# Patient Record
Sex: Female | Born: 1965 | Race: White | Hispanic: No | Smoking: Never smoker
Health system: Southern US, Community
[De-identification: ages and names within clinical notes are randomized; demographics above are authoritative.]

## PROBLEM LIST (undated history)

## (undated) DIAGNOSIS — F419 Anxiety disorder, unspecified: Secondary | ICD-10-CM

## (undated) DIAGNOSIS — C801 Malignant (primary) neoplasm, unspecified: Secondary | ICD-10-CM

## (undated) DIAGNOSIS — Z87442 Personal history of urinary calculi: Secondary | ICD-10-CM

## (undated) DIAGNOSIS — R519 Headache, unspecified: Secondary | ICD-10-CM

## (undated) DIAGNOSIS — G8929 Other chronic pain: Secondary | ICD-10-CM

## (undated) DIAGNOSIS — M549 Dorsalgia, unspecified: Secondary | ICD-10-CM

## (undated) DIAGNOSIS — J45909 Unspecified asthma, uncomplicated: Secondary | ICD-10-CM

## (undated) DIAGNOSIS — R51 Headache: Secondary | ICD-10-CM

## (undated) DIAGNOSIS — M1990 Unspecified osteoarthritis, unspecified site: Secondary | ICD-10-CM

## (undated) DIAGNOSIS — R200 Anesthesia of skin: Secondary | ICD-10-CM

## (undated) HISTORY — PX: BACK SURGERY: SHX140

---

## 1998-06-04 DIAGNOSIS — C801 Malignant (primary) neoplasm, unspecified: Secondary | ICD-10-CM

## 1998-06-04 HISTORY — PX: ABDOMINAL HYSTERECTOMY: SHX81

## 1998-06-04 HISTORY — DX: Malignant (primary) neoplasm, unspecified: C80.1

## 2011-12-10 DIAGNOSIS — M5136 Other intervertebral disc degeneration, lumbar region: Secondary | ICD-10-CM | POA: Insufficient documentation

## 2011-12-26 DIAGNOSIS — F419 Anxiety disorder, unspecified: Secondary | ICD-10-CM | POA: Diagnosis present

## 2016-01-22 DIAGNOSIS — G894 Chronic pain syndrome: Secondary | ICD-10-CM | POA: Diagnosis present

## 2016-04-11 ENCOUNTER — Inpatient Hospital Stay (HOSPITAL_COMMUNITY)
Admission: EM | Admit: 2016-04-11 | Discharge: 2016-04-13 | DRG: 694 | Disposition: A | Discharge status: Home / Self Care | Payer: Medicare PPO | Attending: Nephrology | Admitting: Nephrology

## 2016-04-11 ENCOUNTER — Emergency Department (HOSPITAL_COMMUNITY): Payer: Medicare PPO

## 2016-04-11 ENCOUNTER — Encounter (HOSPITAL_COMMUNITY): Payer: Self-pay | Admitting: Emergency Medicine

## 2016-04-11 DIAGNOSIS — Z79891 Long term (current) use of opiate analgesic: Secondary | ICD-10-CM

## 2016-04-11 DIAGNOSIS — J4540 Moderate persistent asthma, uncomplicated: Secondary | ICD-10-CM | POA: Diagnosis present

## 2016-04-11 DIAGNOSIS — G894 Chronic pain syndrome: Secondary | ICD-10-CM | POA: Diagnosis not present

## 2016-04-11 DIAGNOSIS — M961 Postlaminectomy syndrome, not elsewhere classified: Secondary | ICD-10-CM | POA: Diagnosis present

## 2016-04-11 DIAGNOSIS — Z79899 Other long term (current) drug therapy: Secondary | ICD-10-CM

## 2016-04-11 DIAGNOSIS — N200 Calculus of kidney: Secondary | ICD-10-CM | POA: Diagnosis not present

## 2016-04-11 DIAGNOSIS — Z91030 Bee allergy status: Secondary | ICD-10-CM

## 2016-04-11 DIAGNOSIS — N132 Hydronephrosis with renal and ureteral calculous obstruction: Principal | ICD-10-CM | POA: Diagnosis present

## 2016-04-11 DIAGNOSIS — Z87442 Personal history of urinary calculi: Secondary | ICD-10-CM

## 2016-04-11 DIAGNOSIS — F419 Anxiety disorder, unspecified: Secondary | ICD-10-CM | POA: Diagnosis present

## 2016-04-11 DIAGNOSIS — Z882 Allergy status to sulfonamides status: Secondary | ICD-10-CM

## 2016-04-11 DIAGNOSIS — R52 Pain, unspecified: Secondary | ICD-10-CM

## 2016-04-11 DIAGNOSIS — Z881 Allergy status to other antibiotic agents status: Secondary | ICD-10-CM

## 2016-04-11 DIAGNOSIS — Z886 Allergy status to analgesic agent status: Secondary | ICD-10-CM

## 2016-04-11 HISTORY — DX: Dorsalgia, unspecified: M54.9

## 2016-04-11 HISTORY — DX: Other chronic pain: G89.29

## 2016-04-11 HISTORY — DX: Anxiety disorder, unspecified: F41.9

## 2016-04-11 LAB — CBC WITH DIFFERENTIAL/PLATELET
Basophils Absolute: 0 10*3/uL (ref 0.0–0.1)
Basophils Relative: 0 %
EOS PCT: 3 %
Eosinophils Absolute: 0.2 10*3/uL (ref 0.0–0.7)
HCT: 38.9 % (ref 36.0–46.0)
Hemoglobin: 12.8 g/dL (ref 12.0–15.0)
LYMPHS ABS: 2.4 10*3/uL (ref 0.7–4.0)
Lymphocytes Relative: 46 %
MCH: 30.4 pg (ref 26.0–34.0)
MCHC: 32.9 g/dL (ref 30.0–36.0)
MCV: 92.4 fL (ref 78.0–100.0)
MONO ABS: 0.5 10*3/uL (ref 0.1–1.0)
MONOS PCT: 11 %
Neutro Abs: 2.1 10*3/uL (ref 1.7–7.7)
Neutrophils Relative %: 40 %
PLATELETS: 232 10*3/uL (ref 150–400)
RBC: 4.21 MIL/uL (ref 3.87–5.11)
RDW: 12.7 % (ref 11.5–15.5)
WBC: 5.2 10*3/uL (ref 4.0–10.5)

## 2016-04-11 LAB — URINE MICROSCOPIC-ADD ON: RBC / HPF: NONE SEEN RBC/hpf (ref 0–5)

## 2016-04-11 LAB — BASIC METABOLIC PANEL
Anion gap: 8 (ref 5–15)
BUN: 18 mg/dL (ref 6–20)
CALCIUM: 9.3 mg/dL (ref 8.9–10.3)
CO2: 29 mmol/L (ref 22–32)
Chloride: 101 mmol/L (ref 101–111)
Creatinine, Ser: 0.98 mg/dL (ref 0.44–1.00)
GFR calc Af Amer: >60 mL/min (ref >60)
GLUCOSE: 115 mg/dL — AB (ref 65–99)
Potassium: 4.3 mmol/L (ref 3.5–5.1)
Sodium: 138 mmol/L (ref 135–145)

## 2016-04-11 LAB — URINALYSIS, ROUTINE W REFLEX MICROSCOPIC
Bilirubin Urine: NEGATIVE
GLUCOSE, UA: NEGATIVE mg/dL
HGB URINE DIPSTICK: NEGATIVE
Ketones, ur: NEGATIVE mg/dL
Nitrite: NEGATIVE
PH: 7 (ref 5.0–8.0)
PROTEIN: NEGATIVE mg/dL
Specific Gravity, Urine: 1.012 (ref 1.005–1.030)

## 2016-04-11 MED ORDER — DIPHENHYDRAMINE HCL 50 MG/ML IJ SOLN
25.0000 mg | Freq: Once | INTRAMUSCULAR | Status: AC
  Administered 2016-04-11: 25 mg via INTRAVENOUS
  Filled 2016-04-11: qty 1

## 2016-04-11 MED ORDER — MORPHINE SULFATE (PF) 4 MG/ML IV SOLN: 4.0000 mg | INTRAVENOUS | Status: DC | PRN

## 2016-04-11 MED ORDER — SODIUM CHLORIDE 0.9 % IV SOLN
Freq: Once | INTRAVENOUS | Status: AC
  Administered 2016-04-11: 21:00:00 via INTRAVENOUS

## 2016-04-11 MED ORDER — ONDANSETRON HCL 4 MG/2ML IJ SOLN
4.0000 mg | Freq: Once | INTRAMUSCULAR | Status: AC
  Administered 2016-04-11: 4 mg via INTRAVENOUS
  Filled 2016-04-11: qty 2

## 2016-04-11 MED ORDER — HYDROMORPHONE HCL 2 MG/ML IJ SOLN: 1.0000 mg | Freq: Once | INTRAMUSCULAR | Status: DC

## 2016-04-11 MED ORDER — HYDROMORPHONE HCL 2 MG/ML IJ SOLN
1.0000 mg | INTRAMUSCULAR | Status: AC | PRN
  Administered 2016-04-11 (×2): 1 mg via INTRAVENOUS
  Filled 2016-04-11 (×2): qty 1

## 2016-04-11 MED ORDER — DEXTROSE 5 % IV SOLN
1.0000 g | Freq: Once | INTRAVENOUS | Status: AC
  Administered 2016-04-11: 1 g via INTRAVENOUS
  Filled 2016-04-11: qty 10

## 2016-04-11 MED ORDER — SODIUM CHLORIDE 0.9 % IV BOLUS (SEPSIS)
1000.0000 mL | Freq: Once | INTRAVENOUS | Status: AC
  Administered 2016-04-11: 1000 mL via INTRAVENOUS
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
## 2016-04-11 NOTE — ED Triage Notes (Signed)
Per EMS: pt from airport c/o dizziness upon leaving flight with some nausea

## 2016-04-11 NOTE — ED Notes (Signed)
Patient transported to CT 
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
## 2016-04-11 NOTE — ED Notes (Signed)
repaged urology

## 2016-04-12 ENCOUNTER — Observation Stay (HOSPITAL_COMMUNITY): Payer: Medicare PPO | Admitting: Certified Registered Nurse Anesthetist

## 2016-04-12 ENCOUNTER — Encounter (HOSPITAL_COMMUNITY)
Admission: EM | Disposition: A | Discharge status: Home / Self Care | Payer: Self-pay | Source: Home / Self Care | Attending: Nephrology

## 2016-04-12 ENCOUNTER — Inpatient Hospital Stay: Admit: 2016-04-12 | Payer: Medicare PPO | Admitting: Urology

## 2016-04-12 DIAGNOSIS — Z881 Allergy status to other antibiotic agents status: Secondary | ICD-10-CM | POA: Diagnosis not present

## 2016-04-12 DIAGNOSIS — M961 Postlaminectomy syndrome, not elsewhere classified: Secondary | ICD-10-CM | POA: Diagnosis present

## 2016-04-12 DIAGNOSIS — Z87442 Personal history of urinary calculi: Secondary | ICD-10-CM | POA: Diagnosis not present

## 2016-04-12 DIAGNOSIS — J4540 Moderate persistent asthma, uncomplicated: Secondary | ICD-10-CM | POA: Diagnosis present

## 2016-04-12 DIAGNOSIS — N132 Hydronephrosis with renal and ureteral calculous obstruction: Secondary | ICD-10-CM | POA: Diagnosis present

## 2016-04-12 DIAGNOSIS — Z91030 Bee allergy status: Secondary | ICD-10-CM | POA: Diagnosis not present

## 2016-04-12 DIAGNOSIS — Z886 Allergy status to analgesic agent status: Secondary | ICD-10-CM | POA: Diagnosis not present

## 2016-04-12 DIAGNOSIS — Z79899 Other long term (current) drug therapy: Secondary | ICD-10-CM | POA: Diagnosis not present

## 2016-04-12 DIAGNOSIS — G894 Chronic pain syndrome: Secondary | ICD-10-CM | POA: Diagnosis present

## 2016-04-12 DIAGNOSIS — Z79891 Long term (current) use of opiate analgesic: Secondary | ICD-10-CM | POA: Diagnosis not present

## 2016-04-12 DIAGNOSIS — Z882 Allergy status to sulfonamides status: Secondary | ICD-10-CM | POA: Diagnosis not present

## 2016-04-12 DIAGNOSIS — R52 Pain, unspecified: Secondary | ICD-10-CM | POA: Diagnosis not present

## 2016-04-12 DIAGNOSIS — N200 Calculus of kidney: Secondary | ICD-10-CM | POA: Diagnosis present

## 2016-04-12 DIAGNOSIS — F419 Anxiety disorder, unspecified: Secondary | ICD-10-CM | POA: Diagnosis present

## 2016-04-12 HISTORY — PX: CYSTOSCOPY W/ URETERAL STENT PLACEMENT: SHX1429

## 2016-04-12 LAB — RAPID URINE DRUG SCREEN, HOSP PERFORMED
AMPHETAMINES: NOT DETECTED
BENZODIAZEPINES: NOT DETECTED
Barbiturates: NOT DETECTED
COCAINE: NOT DETECTED
OPIATES: POSITIVE — AB
Tetrahydrocannabinol: NOT DETECTED

## 2016-04-12 LAB — SURGICAL PCR SCREEN
MRSA, PCR: NEGATIVE
Staphylococcus aureus: POSITIVE — AB

## 2016-04-12 SURGERY — CYSTOSCOPY, WITH RETROGRADE PYELOGRAM AND URETERAL STENT INSERTION
Anesthesia: General | Laterality: Right

## 2016-04-12 MED ORDER — 0.9 % SODIUM CHLORIDE (POUR BTL) OPTIME
TOPICAL | Status: DC | PRN
  Administered 2016-04-12: 1000 mL

## 2016-04-12 MED ORDER — ONDANSETRON HCL 4 MG/2ML IJ SOLN
INTRAMUSCULAR | Status: DC | PRN
  Administered 2016-04-12: 4 mg via INTRAVENOUS

## 2016-04-12 MED ORDER — FENTANYL CITRATE (PF) 100 MCG/2ML IJ SOLN
INTRAMUSCULAR | Status: AC
  Filled 2016-04-12: qty 2

## 2016-04-12 MED ORDER — ACETAMINOPHEN 325 MG PO TABS
650.0000 mg | ORAL_TABLET | Freq: Four times a day (QID) | ORAL | Status: DC | PRN
  Administered 2016-04-13: 650 mg via ORAL
  Filled 2016-04-12: qty 2

## 2016-04-12 MED ORDER — ONDANSETRON HCL 4 MG/2ML IJ SOLN
4.0000 mg | Freq: Four times a day (QID) | INTRAMUSCULAR | Status: DC | PRN
Start: 2016-04-12 — End: 2016-04-13
  Administered 2016-04-12: 4 mg via INTRAVENOUS
  Filled 2016-04-12: qty 2

## 2016-04-12 MED ORDER — LIDOCAINE HCL (CARDIAC) 20 MG/ML IV SOLN
INTRAVENOUS | Status: DC | PRN
  Administered 2016-04-12: 100 mg via INTRAVENOUS

## 2016-04-12 MED ORDER — LACTATED RINGERS IV SOLN
INTRAVENOUS | Status: DC | PRN
  Administered 2016-04-12: 18:00:00 via INTRAVENOUS

## 2016-04-12 MED ORDER — PROPOFOL 10 MG/ML IV BOLUS
INTRAVENOUS | Status: AC
Start: 2016-04-12 — End: 2016-04-12
  Filled 2016-04-12: qty 20

## 2016-04-12 MED ORDER — MIDAZOLAM HCL 2 MG/2ML IJ SOLN
INTRAMUSCULAR | Status: AC
  Filled 2016-04-12: qty 2

## 2016-04-12 MED ORDER — SUCCINYLCHOLINE CHLORIDE 20 MG/ML IJ SOLN
INTRAMUSCULAR | Status: AC
  Filled 2016-04-12: qty 1

## 2016-04-12 MED ORDER — IOHEXOL 300 MG/ML  SOLN
INTRAMUSCULAR | Status: DC | PRN
  Administered 2016-04-12: 5 mL via INTRAVENOUS

## 2016-04-12 MED ORDER — OXYCODONE HCL 5 MG PO TABS
5.0000 mg | ORAL_TABLET | ORAL | Status: DC | PRN
  Administered 2016-04-12 – 2016-04-13 (×3): 5 mg via ORAL
  Filled 2016-04-12 (×3): qty 1

## 2016-04-12 MED ORDER — ONDANSETRON HCL 4 MG PO TABS: 4.0000 mg | ORAL_TABLET | Freq: Four times a day (QID) | ORAL | Status: DC | PRN

## 2016-04-12 MED ORDER — HYDROMORPHONE HCL 1 MG/ML IJ SOLN
INTRAMUSCULAR | Status: AC
  Administered 2016-04-12: 0.5 mg via INTRAVENOUS
  Filled 2016-04-12: qty 1

## 2016-04-12 MED ORDER — SUCCINYLCHOLINE CHLORIDE 20 MG/ML IJ SOLN
INTRAMUSCULAR | Status: DC | PRN
  Administered 2016-04-12: 100 mg via INTRAVENOUS

## 2016-04-12 MED ORDER — ONDANSETRON HCL 4 MG/2ML IJ SOLN
INTRAMUSCULAR | Status: AC
  Filled 2016-04-12: qty 2

## 2016-04-12 MED ORDER — PROMETHAZINE HCL 25 MG/ML IJ SOLN
6.2500 mg | INTRAMUSCULAR | Status: DC | PRN
  Administered 2016-04-12: 6.25 mg via INTRAVENOUS

## 2016-04-12 MED ORDER — MORPHINE SULFATE ER 30 MG PO TBCR
30.0000 mg | EXTENDED_RELEASE_TABLET | Freq: Two times a day (BID) | ORAL | Status: DC
  Administered 2016-04-12 – 2016-04-13 (×3): 30 mg via ORAL
  Filled 2016-04-12 (×5): qty 1

## 2016-04-12 MED ORDER — GABAPENTIN 400 MG PO CAPS
800.0000 mg | ORAL_CAPSULE | Freq: Three times a day (TID) | ORAL | Status: DC
  Administered 2016-04-12 – 2016-04-13 (×3): 800 mg via ORAL
  Filled 2016-04-12 (×4): qty 2

## 2016-04-12 MED ORDER — SODIUM CHLORIDE 0.9 % IV SOLN
INTRAVENOUS | Status: DC
  Administered 2016-04-12 – 2016-04-13 (×4): via INTRAVENOUS

## 2016-04-12 MED ORDER — DEXAMETHASONE SODIUM PHOSPHATE 10 MG/ML IJ SOLN
INTRAMUSCULAR | Status: AC
  Filled 2016-04-12: qty 1

## 2016-04-12 MED ORDER — LIDOCAINE 2% (20 MG/ML) 5 ML SYRINGE
INTRAMUSCULAR | Status: AC
  Filled 2016-04-12: qty 5

## 2016-04-12 MED ORDER — ENOXAPARIN SODIUM 40 MG/0.4ML ~~LOC~~ SOLN
40.0000 mg | SUBCUTANEOUS | Status: DC
  Filled 2016-04-12 (×2): qty 0.4

## 2016-04-12 MED ORDER — OXYCODONE-ACETAMINOPHEN 5-325 MG PO TABS: 1.0000 | ORAL_TABLET | Freq: Four times a day (QID) | ORAL | Status: DC | PRN

## 2016-04-12 MED ORDER — TIZANIDINE HCL 4 MG PO TABS
4.0000 mg | ORAL_TABLET | Freq: Two times a day (BID) | ORAL | Status: DC
  Administered 2016-04-12 – 2016-04-13 (×2): 4 mg via ORAL
  Filled 2016-04-12 (×4): qty 1

## 2016-04-12 MED ORDER — PROPOFOL 10 MG/ML IV BOLUS
INTRAVENOUS | Status: DC | PRN
  Administered 2016-04-12: 170 mg via INTRAVENOUS

## 2016-04-12 MED ORDER — FENTANYL CITRATE (PF) 100 MCG/2ML IJ SOLN
INTRAMUSCULAR | Status: DC | PRN
  Administered 2016-04-12 (×2): 50 ug via INTRAVENOUS

## 2016-04-12 MED ORDER — CIPROFLOXACIN IN D5W 400 MG/200ML IV SOLN
INTRAVENOUS | Status: DC | PRN
  Administered 2016-04-12: 400 mg via INTRAVENOUS

## 2016-04-12 MED ORDER — MIDAZOLAM HCL 5 MG/5ML IJ SOLN
INTRAMUSCULAR | Status: DC | PRN
  Administered 2016-04-12: 2 mg via INTRAVENOUS

## 2016-04-12 MED ORDER — HYDROMORPHONE HCL 1 MG/ML IJ SOLN
1.0000 mg | INTRAMUSCULAR | Status: DC | PRN
  Administered 2016-04-12: 1 mg via INTRAVENOUS
  Filled 2016-04-12 (×2): qty 1

## 2016-04-12 MED ORDER — SODIUM CHLORIDE 0.9 % IR SOLN
Status: DC | PRN
  Administered 2016-04-12: 3000 mL

## 2016-04-12 MED ORDER — HYDROMORPHONE HCL 1 MG/ML IJ SOLN
0.2500 mg | INTRAMUSCULAR | Status: DC | PRN
  Administered 2016-04-12 (×4): 0.5 mg via INTRAVENOUS

## 2016-04-12 MED ORDER — HYDROMORPHONE HCL 1 MG/ML IJ SOLN
1.0000 mg | INTRAMUSCULAR | Status: DC | PRN
  Administered 2016-04-12 – 2016-04-13 (×2): 1 mg via INTRAVENOUS
  Filled 2016-04-12: qty 1

## 2016-04-12 MED ORDER — CIPROFLOXACIN IN D5W 400 MG/200ML IV SOLN
INTRAVENOUS | Status: AC
  Filled 2016-04-12: qty 200

## 2016-04-12 MED ORDER — DEXAMETHASONE SODIUM PHOSPHATE 10 MG/ML IJ SOLN
INTRAMUSCULAR | Status: DC | PRN
  Administered 2016-04-12: 10 mg via INTRAVENOUS

## 2016-04-12 MED ORDER — MOMETASONE FURO-FORMOTEROL FUM 200-5 MCG/ACT IN AERO
2.0000 | INHALATION_SPRAY | Freq: Two times a day (BID) | RESPIRATORY_TRACT | Status: DC
  Administered 2016-04-12 – 2016-04-13 (×3): 2 via RESPIRATORY_TRACT
  Filled 2016-04-12: qty 8.8

## 2016-04-12 MED ORDER — VENLAFAXINE HCL 50 MG PO TABS
50.0000 mg | ORAL_TABLET | Freq: Two times a day (BID) | ORAL | Status: DC
Start: 2016-04-12 — End: 2016-04-13
  Administered 2016-04-12 – 2016-04-13 (×2): 50 mg via ORAL
  Filled 2016-04-12 (×4): qty 1

## 2016-04-12 MED ORDER — ACETAMINOPHEN 650 MG RE SUPP: 650.0000 mg | Freq: Four times a day (QID) | RECTAL | Status: DC | PRN

## 2016-04-12 MED ORDER — PROMETHAZINE HCL 25 MG/ML IJ SOLN
INTRAMUSCULAR | Status: AC
  Filled 2016-04-12: qty 1

## 2016-04-12 SURGICAL SUPPLY — 11 items
BAG URO CATCHER STRL LF (MISCELLANEOUS) ×3 IMPLANT
CATH INTERMIT  6FR 70CM (CATHETERS) ×3 IMPLANT
CLOTH BEACON ORANGE TIMEOUT ST (SAFETY) ×3 IMPLANT
GLOVE BIOGEL M STRL SZ7.5 (GLOVE) ×3 IMPLANT
GOWN STRL REUS W/TWL LRG LVL3 (GOWN DISPOSABLE) ×6 IMPLANT
GUIDEWIRE STR DUAL SENSOR (WIRE) ×3 IMPLANT
MANIFOLD NEPTUNE II (INSTRUMENTS) ×3 IMPLANT
PACK CYSTO (CUSTOM PROCEDURE TRAY) ×3 IMPLANT
STENT CONTOUR 6FRX26X.038 (STENTS) ×3 IMPLANT
TUBING CONNECTING 10 (TUBING) IMPLANT
TUBING CONNECTING 10' (TUBING)

## 2016-04-12 NOTE — H&P (View-Only) (Signed)
Urology Consult  Referring physician: Katheran James Reason for referral: Colic   Chief Complaint: Colic  History of Present Illness: Right flank pain; ESWL unsuccessful; on trip to [place]; social history issues reviewed; transferred here from hospital;  Finally admitted ? Due to pain vs. Social aspects CT scan: Mild right hydro due to 5 mm stone at right pelvic brim Still having right flank pain and some nausea Afebrile Previous uscopes by history Modifying factors: There are no other modifying factors  Associated signs and symptoms: There are no other associated signs and symptoms Aggravating and relieving factors: There are no other aggravating or relieving factors Severity: Moderate Duration: Persistent     Past Medical History:  Diagnosis Date  . Anxiety   . Chronic back pain   . Kidney stones    History reviewed. No pertinent surgical history.  Medications: I have reviewed the patient's current medications. Allergies:  Allergies  Allergen Reactions  . Bee Venom Anaphylaxis  . Sulfamethoxazole Other (See Comments)  . Aspirin     Hives    . Sulfa Antibiotics     Upset stomach     Family History  Problem Relation Age of Onset  . Diabetes Mother   . Heart Problems Mother   . Leukemia Father    Social History:  reports that she has never smoked. She has never used smokeless tobacco. She reports that she does not drink alcohol or use drugs.  ROS: All systems are reviewed and negative except as noted. Rest negative  Physical Exam:  Vital signs in last 24 hours: Temp:  [98 F (36.7 C)-98.3 F (36.8 C)] 98 F (36.7 C) (11/09 0550) Pulse Rate:  [67-81] 67 (11/09 0550) Resp:  [9-20] 16 (11/09 0550) BP: (94-119)/(57-80) 106/75 (11/09 0550) SpO2:  [94 %-100 %] 97 % (11/09 1210) Weight:  [80.7 kg (178 lb)-92 kg (202 lb 12.8 oz)] 92 kg (202 lb 12.8 oz) (11/09 0110)  Cardiovascular: Skin warm; not flushed Respiratory: Breaths quiet; no shortness of  breath Abdomen: No masses Neurological: Normal sensation to touch Musculoskeletal: Normal motor function arms and legs Lymphatics: No inguinal adenopathy Skin: No rashes Genitourinary:looks a bit uncomfortable but non-toxic; no acute tenderness  Laboratory Data:  Results for orders placed or performed during the hospital encounter of 04/11/16 (from the past 72 hour(s))  CBC with Differential/Platelet     Status: None   Collection Time: 04/11/16  7:05 PM  Result Value Ref Range   WBC 5.2 4.0 - 10.5 K/uL   RBC 4.21 3.87 - 5.11 MIL/uL   Hemoglobin 12.8 12.0 - 15.0 g/dL   HCT 38.9 36.0 - 46.0 %   MCV 92.4 78.0 - 100.0 fL   MCH 30.4 26.0 - 34.0 pg   MCHC 32.9 30.0 - 36.0 g/dL   RDW 12.7 11.5 - 15.5 %   Platelets 232 150 - 400 K/uL   Neutrophils Relative % 40 %   Neutro Abs 2.1 1.7 - 7.7 K/uL   Lymphocytes Relative 46 %   Lymphs Abs 2.4 0.7 - 4.0 K/uL   Monocytes Relative 11 %   Monocytes Absolute 0.5 0.1 - 1.0 K/uL   Eosinophils Relative 3 %   Eosinophils Absolute 0.2 0.0 - 0.7 K/uL   Basophils Relative 0 %   Basophils Absolute 0.0 0.0 - 0.1 K/uL  Basic metabolic panel     Status: Abnormal   Collection Time: 04/11/16  7:05 PM  Result Value Ref Range   Sodium 138 135 - 145 mmol/L  Potassium 4.3 3.5 - 5.1 mmol/L   Chloride 101 101 - 111 mmol/L   CO2 29 22 - 32 mmol/L   Glucose, Bld 115 (H) 65 - 99 mg/dL   BUN 18 6 - 20 mg/dL   Creatinine, Ser 0.98 0.44 - 1.00 mg/dL   Calcium 9.3 8.9 - 10.3 mg/dL   GFR calc non Af Amer >60 >60 mL/min   GFR calc Af Amer >60 >60 mL/min    Comment: (NOTE) The eGFR has been calculated using the CKD EPI equation. This calculation has not been validated in all clinical situations. eGFR's persistently <60 mL/min signify possible Chronic Kidney Disease.    Anion gap 8 5 - 15  Urinalysis, Routine w reflex microscopic (not at [facility])     Status: Abnormal   Collection Time: 04/11/16  8:32 PM  Result Value Ref Range   Color, Urine YELLOW YELLOW    APPearance CLEAR CLEAR   Specific Gravity, Urine 1.012 1.005 - 1.030   pH 7.0 5.0 - 8.0   Glucose, UA NEGATIVE NEGATIVE mg/dL   Hgb urine dipstick NEGATIVE NEGATIVE   Bilirubin Urine NEGATIVE NEGATIVE   Ketones, ur NEGATIVE NEGATIVE mg/dL   Protein, ur NEGATIVE NEGATIVE mg/dL   Nitrite NEGATIVE NEGATIVE   Leukocytes, UA MODERATE (A) NEGATIVE  Urine microscopic-add on     Status: Abnormal   Collection Time: 04/11/16  8:32 PM  Result Value Ref Range   Squamous Epithelial / LPF 0-5 (A) NONE SEEN   WBC, UA 6-30 0 - 5 WBC/hpf   RBC / HPF NONE SEEN 0 - 5 RBC/hpf   Bacteria, UA RARE (A) NONE SEEN  Surgical pcr screen     Status: Abnormal   Collection Time: 04/12/16  2:06 AM  Result Value Ref Range   MRSA, PCR NEGATIVE NEGATIVE   Staphylococcus aureus POSITIVE (A) NEGATIVE    Comment:        The Xpert SA Assay (FDA approved for NASAL specimens in patients over 80 years of age), is one component of a comprehensive surveillance program.  Test performance has been validated by [facility] for patients greater than or equal to 28 year old. It is not intended to diagnose infection nor to guide or monitor treatment.   Urine rapid drug screen (hosp performed)     Status: Abnormal   Collection Time: 04/12/16  9:10 AM  Result Value Ref Range   Opiates POSITIVE (A) NONE DETECTED   Cocaine NONE DETECTED NONE DETECTED   Benzodiazepines NONE DETECTED NONE DETECTED   Amphetamines NONE DETECTED NONE DETECTED   Tetrahydrocannabinol NONE DETECTED NONE DETECTED   Barbiturates NONE DETECTED NONE DETECTED    Comment:        DRUG SCREEN FOR MEDICAL PURPOSES ONLY.  IF CONFIRMATION IS NEEDED FOR ANY PURPOSE, NOTIFY LAB WITHIN 5 DAYS.        LOWEST DETECTABLE LIMITS FOR URINE DRUG SCREEN Drug Class       Cutoff (ng/mL) Amphetamine      1000 Barbiturate      200 Benzodiazepine   505 Tricyclics       397 Opiates          300 Cocaine          300 THC              50    Recent Results (from  the past 240 hour(s))  Surgical pcr screen     Status: Abnormal   Collection Time: 04/12/16  2:06 AM  Result Value Ref Range Status   MRSA, PCR NEGATIVE NEGATIVE Final   Staphylococcus aureus POSITIVE (A) NEGATIVE Final    Comment:        The Xpert SA Assay (FDA approved for NASAL specimens in patients over 64 years of age), is one component of a comprehensive surveillance program.  Test performance has been validated by [facility] for patients greater than or equal to 8 year old. It is not intended to diagnose infection nor to guide or monitor treatment.    Creatinine:  Recent Labs  04/11/16 1905  CREATININE 0.98    Xrays: See report/chart Reviewed   Impression/Assessment:  Right ureteral stone with colic  Plan:  Reivewed cystoscopy and right retrograde and stent; with delayed u-scope; increased risk of ureteral edema and perforation discussed post ESWL if ESWL was at that level Patient was told stone was 6  Picture drawn; due to socal keep patient in overnight post stent After a thorough review of the management options for the patient's condition the patient  elected to proceed with surgical therapy as noted above. We have discussed the potential benefits and risks of the procedure, side effects of the proposed treatment, the likelihood of the patient achieving the goals of the procedure, and any potential problems that might occur during the procedure or recuperation. Informed consent has been obtained.  MACDIARMID,SCOTT A 04/12/2016, 12:15 PM

## 2016-04-12 NOTE — Care Management Obs Status (Signed)
[place] NOTIFICATION   Patient Details  Name: Linda Hogan MRN: [id number] Date of Birth: 1966/04/25   Medicare Observation Status Notification Given:  Yes    Guadalupe Maple, RN 04/12/2016, 12:26 PM

## 2016-04-12 NOTE — Anesthesia Preprocedure Evaluation (Signed)
Anesthesia Evaluation  Patient identified by MRN, date of birth, ID band Patient awake    Airway Mallampati: II  TM Distance: >3 FB Neck ROM: Full    Dental  (+) Teeth Intact, Dental Advisory Given   Pulmonary asthma ,    breath sounds clear to auscultation       Cardiovascular negative cardio ROS   Rhythm:Regular Rate:Normal     Neuro/Psych    GI/Hepatic   Endo/Other    Renal/GU Renal diseaseRenal stones     Musculoskeletal  (+) Arthritis , Chronic back pain   Abdominal (+) + obese,   Peds  Hematology   Anesthesia Other Findings   Reproductive/Obstetrics                             Anesthesia Physical Anesthesia Plan  ASA: II  Anesthesia Plan: General   Post-op Pain Management:    Induction: Intravenous  Airway Management Planned: Oral ETT  Additional Equipment:   Intra-op Plan:   Post-operative Plan: Extubation in OR  Informed Consent: I have reviewed the patients History and Physical, chart, labs and discussed the procedure including the risks, benefits and alternatives for the proposed anesthesia with the patient or authorized representative who has indicated his/her understanding and acceptance.   Dental advisory given  Plan Discussed with: CRNA  Anesthesia Plan Comments:         Anesthesia Quick Evaluation

## 2016-04-12 NOTE — Interval H&P Note (Signed)
History and Physical Interval Note:  04/12/2016 5:47 PM  Linda Hogan  has presented today for surgery, with the diagnosis of right ureteral stone  The various methods of treatment have been discussed with the patient and family. After consideration of risks, benefits and other options for treatment, the patient has consented to  Procedure(s): CYSTOSCOPY WITH RETROGRADE PYELOGRAM/URETERAL STENT PLACEMENT (Right) as a surgical intervention .  The patient's history has been reviewed, patient examined, no change in status, stable for surgery.  I have reviewed the patient's chart and labs.  Questions were answered to the patient's satisfaction.     Arrian Manson A

## 2016-04-12 NOTE — Progress Notes (Signed)
PROGRESS NOTE    Linda Hogan  [id number] DOB: [birth year] DOA: 04/11/2016 PCP: Tawni Pummel, MD   Brief Narrative: 50 year old female with history of asthma, diabetes, nephrolithiasis presented with sudden onset of right flank pain. Patient was recently seen in the ER for nephrolithiasis status post lithotripsy and was discharged on Levaquin.  Assessment & Plan:   #Right sided 5 mm ureteral calculus with mild right hydronephrosis presented with renal colic: Serum creatinine level is stable. Discussed with the urologist today. Patient is nothing by mouth. Likely urological procedure including stent placement today.  -Continue IV fluid and management and supportive care. Recently complicated antibiotics course.  #  Asthma, chronic, moderate persistent, uncomplicated: Currently stable. Continue with the breathing treatment  #Anxiety: Continue home medications.  I discussed with the urologist and patient's nurse.  DVT prophylaxis: Lovenox subcutaneous. Holding today because of possible procedure.  Code Status: full code  Family Communication: no family present at bedside  Disposition Plan: likely discharge home in 1-2 days     Consultants:    Urologist  Procedures: none  Antimicrobials: none   Subjective: patient was seen and examined at bedside. Patient reported) pain many many improving with the Dilaudid. Has nausea and weakness. Denied chest pain, shortness of breath, dizziness.    Objective: Vitals:   04/12/16 0008 04/12/16 0110 04/12/16 0550 04/12/16 1210  BP: 110/65 108/80 106/75   Pulse: 74 71 67   Resp: 16 16 16    Temp: 98.3 F (36.8 C) 98.2 F (36.8 C) 98 F (36.7 C)   TempSrc: Oral Oral Oral   SpO2: 98% 98% 100% 97%  Weight:  92 kg (202 lb 12.8 oz)    Height:  5\' 8"  (1.727 m)      Intake/Output Summary (Last 24 hours) at 04/12/16 1246 Last data filed at 04/12/16 1000  Gross per 24 hour  Intake          3527.08 ml  Output              2200 ml  Net          1327.08 ml   Filed Weights   04/11/16 1716 04/12/16 0110  Weight: 80.7 kg (178 lb) 92 kg (202 lb 12.8 oz)    Examination:  General exam: Appears calm and comfortable  Respiratory system: Clear to auscultation. Respiratory effort normal. No wheezing or crackle Cardiovascular system: S1 & S2 heard, RRR.  No pedal edema. Gastrointestinal system: Abdomen is nondistended, soft and nontender. Normal bowel sounds heard. Central nervous system: Alert and oriented. No focal neurological deficits. Extremities: Symmetric 5 x 5 power. Skin: No rashes, lesions or ulcers Psychiatry: Judgement and insight appear normal. Mood & affect appropriate.     Data Reviewed: I have personally reviewed following labs and imaging studies  CBC:  Recent Labs Lab 04/11/16 1905  WBC 5.2  NEUTROABS 2.1  HGB 12.8  HCT 38.9  MCV 92.4  PLT [id number]   Basic Metabolic Panel:  Recent Labs Lab 04/11/16 1905  NA 138  K 4.3  CL 101  CO2 29  GLUCOSE 115*  BUN 18  CREATININE 0.98  CALCIUM 9.3   GFR: Estimated Creatinine Clearance: 81.4 mL/min (by C-G formula based on SCr of 0.98 mg/dL). Liver Function Tests: No results for input(s): AST, ALT, ALKPHOS, BILITOT, PROT, ALBUMIN in the last 168 hours. No results for input(s): LIPASE, AMYLASE in the last 168 hours. No results for input(s): AMMONIA in the last 168 hours. Coagulation Profile: No results for  input(s): INR, PROTIME in the last 168 hours. Cardiac Enzymes: No results for input(s): CKTOTAL, CKMB, CKMBINDEX, TROPONINI in the last 168 hours. BNP (last 3 results) No results for input(s): PROBNP in the last 8760 hours. HbA1C: No results for input(s): HGBA1C in the last 72 hours. CBG: No results for input(s): GLUCAP in the last 168 hours. Lipid Profile: No results for input(s): CHOL, HDL, LDLCALC, TRIG, CHOLHDL, LDLDIRECT in the last 72 hours. Thyroid Function Tests: No results for input(s): TSH, T4TOTAL, FREET4, T3FREE,  THYROIDAB in the last 72 hours. Anemia Panel: No results for input(s): VITAMINB12, FOLATE, FERRITIN, TIBC, IRON, RETICCTPCT in the last 72 hours. Sepsis Labs: No results for input(s): PROCALCITON, LATICACIDVEN in the last 168 hours.  Recent Results (from the past 240 hour(s))  Surgical pcr screen     Status: Abnormal   Collection Time: 04/12/16  2:06 AM  Result Value Ref Range Status   MRSA, PCR NEGATIVE NEGATIVE Final   Staphylococcus aureus POSITIVE (A) NEGATIVE Final    Comment:        The Xpert SA Assay (FDA approved for NASAL specimens in patients over 28 years of age), is one component of a comprehensive surveillance program.  Test performance has been validated by [facility] for patients greater than or equal to 22 year old. It is not intended to diagnose infection nor to guide or monitor treatment.          Radiology Studies: Ct Abdomen Pelvis Wo Contrast  Result Date: 04/11/2016 CLINICAL DATA:  Right flank pain with nausea EXAM: CT ABDOMEN AND PELVIS WITHOUT CONTRAST TECHNIQUE: Multidetector CT imaging of the abdomen and pelvis was performed following the standard protocol without IV contrast. COMPARISON:  None. FINDINGS: Lower chest: There are noncalcified tiny bilateral pulmonary nodules seen within both lower lobes and lingula, the largest is in the left lower lobe measuring approximately 4.7 mm. Streaky bibasilar atelectasis and/or minimal scarring is seen. Visualized heart is normal in size. No pericardial effusion. Hepatobiliary: Cholecystectomy. No space-occupying mass of the unenhanced liver or biliary dilatation. Pancreas: Mild fullness of the pancreatic head without ductal dilatation given the technical limitations from this noncontrast study. This could be due to partial volume averaging artifact of adjacent non-opacified bowel. No cystic appearing mass. Spleen: No splenomegaly Adrenals/Urinary Tract: Normal bilateral adrenal glands. Mild right-sided  hydroureteronephrosis secondary to a 5 mm mid ureteral stone at the pelvic brim. Unremarkable left renal collecting system. No nephrolithiasis. Urinary bladder is physiologically distended. Stomach/Bowel: Moderate colonic stool burden. No small bowel obstruction. There is mild duodenal and proximal jejunal fluid-filled distention possibly representing localized ileus could No acute inflammation. Normal appendix. Vascular/Lymphatic: Aortic atherosclerosis. No enlarged abdominal or pelvic lymph nodes. Reproductive: Hysterectomy.  No adnexal mass. Other:  No ascites or free air. Musculoskeletal: There is discogenic sclerosis of the endplates across the [id number] interspace with approximately 5 mm retrolisthesis of L2 on L3. Spinal fixation hardware span L2 through S1 with interbody blocks noted at L2-3 and L3-4 with changes of spinal decompression. Single screw traverses the right SI joint and sacral ala. IMPRESSION: Bilateral noncalcified pulmonary nodules the largest is approximately 4.7 mm in the left lower lobe. No follow-up needed if patient is low-risk (and has no known or suspected primary neoplasm). Non-contrast chest CT can be considered in 12 months if patient is high-risk. This recommendation follows the consensus statement: Guidelines for Management of Incidental Pulmonary Nodules Detected on CT Images: From the Fleischner Society 2017; Radiology 2017; 284:228-243. 5 mm right ureteral calculus  causing mild right-sided hydroureteronephrosis. Mild proximal small bowel fluid-filled distention possibly representing a mild localized ileus. Electronically Signed   By: Ashley Royalty M.D.   On: 04/11/2016 20:03        Scheduled Meds: . enoxaparin (LOVENOX) injection  40 mg Subcutaneous Q24H  . gabapentin  800 mg Oral TID  . mometasone-formoterol  2 puff Inhalation BID  . morphine  30 mg Oral BID  . tiZANidine  4 mg Oral BID  . venlafaxine  50 mg Oral BID   Continuous Infusions: . sodium chloride 125 mL/hr  at 04/12/16 1034     LOS: 1 day    Time spent: 28 minutes.    Dron Tanna Furry, MD [facility] Pager [phone]  If 7PM-7AM, please contact night-coverage [email or web address] Password [email or web address] 04/12/2016, 12:46 PM

## 2016-04-12 NOTE — Op Note (Signed)
Preoperative diagnosis: Right ureteral stone Postoperative diagnosis: Right ureteral stone Surgery: Cystoscopy right retrograde ureterogram and insertion of right ureteral stent Surgeon: Dr. Nicki Reaper Mandrell Vangilder  The patient has the above diagnoses and consented above procedure. The patient had ongoing pain but also multiple social issues during this admission. There was some delay and answering the consult for reasons noted  The patient was prepped and draped in usual fashion. Preoperative antibiotics were given.  24 scope was utilized. Bladder mucosa and trigone were normal. There is no edema or cystitis.  Under cystoscopic and fluoroscopic guidance I passed a sensor wire to the lobe mid ureter right ureter. I then passed an open-ended 6 [place] or real catheter to that level removing the wire. I did a gentle retrograde ureterogram  Retrograde ureterogram: As a separate procedure I instilled approxi-6 mL of contrast. She had minimal hydroureter and hydronephrosis outlining the renal pelvis on the right  Under fluoroscopic guidance a sensor wire was easily passed up into the kidney curling in the upper pole calyx. Over-the-wire I passed a 6 [place] double-J stent 26 x 6 without the string. It curled in the kidney and bladder. X-rays were taken. There was a small volcano affect  Patient was taken to recovery in the procedure went very well

## 2016-04-12 NOTE — Progress Notes (Signed)
Hospitalist, Dr Hal Hope, is aware that patient has been admitted in room 1529 [facility].

## 2016-04-12 NOTE — Transfer of Care (Signed)
Immediate Anesthesia Transfer of Care Note  Patient: Linda Hogan  Procedure(s) Performed: Procedure(s): CYSTOSCOPY WITH RIGHT  RETROGRADE PYELOGRAM/RIGHT URETERAL STENT PLACEMENT (Right)  Patient Location: PACU  Anesthesia Type:General  Level of Consciousness:  sedated, patient cooperative and responds to stimulation  Airway & Oxygen Therapy:Patient Spontanous Breathing and Patient connected to face mask oxgen  Post-op Assessment:  Report given to PACU RN and Post -op Vital signs reviewed and stable  Post vital signs:  Reviewed and stable  Last Vitals:  Vitals:   04/12/16 1400 04/12/16 1513  BP: (!) 102/57 119/73  Pulse: 67 72  Resp: 18 17  Temp: 36.7 C [id number] C    Complications: No apparent anesthesia complications

## 2016-04-12 NOTE — Consult Note (Signed)
Urology Consult  Referring physician: D Bhandari Reason for referral: Colic   Chief Complaint: Colic  History of Present Illness: Right flank pain; ESWL unsuccessful; on trip to [place]; social history issues reviewed; transferred here from hospital;  Finally admitted ? Due to pain vs. Social aspects CT scan: Mild right hydro due to 5 mm stone at right pelvic brim Still having right flank pain and some nausea Afebrile Previous uscopes by history Modifying factors: There are no other modifying factors  Associated signs and symptoms: There are no other associated signs and symptoms Aggravating and relieving factors: There are no other aggravating or relieving factors Severity: Moderate Duration: Persistent     Past Medical History:  Diagnosis Date  . Anxiety   . Chronic back pain   . Kidney stones    History reviewed. No pertinent surgical history.  Medications: I have reviewed the patient's current medications. Allergies:  Allergies  Allergen Reactions  . Bee Venom Anaphylaxis  . Sulfamethoxazole Other (See Comments)  . Aspirin     Hives    . Sulfa Antibiotics     Upset stomach     Family History  Problem Relation Age of Onset  . Diabetes Mother   . Heart Problems Mother   . Leukemia Father    Social History:  reports that she has never smoked. She has never used smokeless tobacco. She reports that she does not drink alcohol or use drugs.  ROS: All systems are reviewed and negative except as noted. Rest negative  Physical Exam:  Vital signs in last 24 hours: Temp:  [98 F (36.7 C)-98.3 F (36.8 C)] 98 F (36.7 C) (11/09 0550) Pulse Rate:  [67-81] 67 (11/09 0550) Resp:  [9-20] 16 (11/09 0550) BP: (94-119)/(57-80) 106/75 (11/09 0550) SpO2:  [94 %-100 %] 97 % (11/09 1210) Weight:  [80.7 kg (178 lb)-92 kg (202 lb 12.8 oz)] 92 kg (202 lb 12.8 oz) (11/09 0110)  Cardiovascular: Skin warm; not flushed Respiratory: Breaths quiet; no shortness of  breath Abdomen: No masses Neurological: Normal sensation to touch Musculoskeletal: Normal motor function arms and legs Lymphatics: No inguinal adenopathy Skin: No rashes Genitourinary:looks a bit uncomfortable but non-toxic; no acute tenderness  Laboratory Data:  Results for orders placed or performed during the hospital encounter of 04/11/16 (from the past 72 hour(s))  CBC with Differential/Platelet     Status: None   Collection Time: 04/11/16  7:05 PM  Result Value Ref Range   WBC 5.2 4.0 - 10.5 K/uL   RBC 4.21 3.87 - 5.11 MIL/uL   Hemoglobin 12.8 12.0 - 15.0 g/dL   HCT 38.9 36.0 - 46.0 %   MCV 92.4 78.0 - 100.0 fL   MCH 30.4 26.0 - 34.0 pg   MCHC 32.9 30.0 - 36.0 g/dL   RDW 12.7 11.5 - 15.5 %   Platelets 232 150 - 400 K/uL   Neutrophils Relative % 40 %   Neutro Abs 2.1 1.7 - 7.7 K/uL   Lymphocytes Relative 46 %   Lymphs Abs 2.4 0.7 - 4.0 K/uL   Monocytes Relative 11 %   Monocytes Absolute 0.5 0.1 - 1.0 K/uL   Eosinophils Relative 3 %   Eosinophils Absolute 0.2 0.0 - 0.7 K/uL   Basophils Relative 0 %   Basophils Absolute 0.0 0.0 - 0.1 K/uL  Basic metabolic panel     Status: Abnormal   Collection Time: 04/11/16  7:05 PM  Result Value Ref Range   Sodium 138 135 - 145 mmol/L     Potassium 4.3 3.5 - 5.1 mmol/L   Chloride 101 101 - 111 mmol/L   CO2 29 22 - 32 mmol/L   Glucose, Bld 115 (H) 65 - 99 mg/dL   BUN 18 6 - 20 mg/dL   Creatinine, Ser 0.98 0.44 - 1.00 mg/dL   Calcium 9.3 8.9 - 10.3 mg/dL   GFR calc non Af Amer >60 >60 mL/min   GFR calc Af Amer >60 >60 mL/min    Comment: (NOTE) The eGFR has been calculated using the CKD EPI equation. This calculation has not been validated in all clinical situations. eGFR's persistently <60 mL/min signify possible Chronic Kidney Disease.    Anion gap 8 5 - 15  Urinalysis, Routine w reflex microscopic (not at [facility])     Status: Abnormal   Collection Time: 04/11/16  8:32 PM  Result Value Ref Range   Color, Urine YELLOW YELLOW    APPearance CLEAR CLEAR   Specific Gravity, Urine 1.012 1.005 - 1.030   pH 7.0 5.0 - 8.0   Glucose, UA NEGATIVE NEGATIVE mg/dL   Hgb urine dipstick NEGATIVE NEGATIVE   Bilirubin Urine NEGATIVE NEGATIVE   Ketones, ur NEGATIVE NEGATIVE mg/dL   Protein, ur NEGATIVE NEGATIVE mg/dL   Nitrite NEGATIVE NEGATIVE   Leukocytes, UA MODERATE (A) NEGATIVE  Urine microscopic-add on     Status: Abnormal   Collection Time: 04/11/16  8:32 PM  Result Value Ref Range   Squamous Epithelial / LPF 0-5 (A) NONE SEEN   WBC, UA 6-30 0 - 5 WBC/hpf   RBC / HPF NONE SEEN 0 - 5 RBC/hpf   Bacteria, UA RARE (A) NONE SEEN  Surgical pcr screen     Status: Abnormal   Collection Time: 04/12/16  2:06 AM  Result Value Ref Range   MRSA, PCR NEGATIVE NEGATIVE   Staphylococcus aureus POSITIVE (A) NEGATIVE    Comment:        The Xpert SA Assay (FDA approved for NASAL specimens in patients over 21 years of age), is one component of a comprehensive surveillance program.  Test performance has been validated by [facility] for patients greater than or equal to 1 year old. It is not intended to diagnose infection nor to guide or monitor treatment.   Urine rapid drug screen (hosp performed)     Status: Abnormal   Collection Time: 04/12/16  9:10 AM  Result Value Ref Range   Opiates POSITIVE (A) NONE DETECTED   Cocaine NONE DETECTED NONE DETECTED   Benzodiazepines NONE DETECTED NONE DETECTED   Amphetamines NONE DETECTED NONE DETECTED   Tetrahydrocannabinol NONE DETECTED NONE DETECTED   Barbiturates NONE DETECTED NONE DETECTED    Comment:        DRUG SCREEN FOR MEDICAL PURPOSES ONLY.  IF CONFIRMATION IS NEEDED FOR ANY PURPOSE, NOTIFY LAB WITHIN 5 DAYS.        LOWEST DETECTABLE LIMITS FOR URINE DRUG SCREEN Drug Class       Cutoff (ng/mL) Amphetamine      1000 Barbiturate      200 Benzodiazepine   200 Tricyclics       300 Opiates          300 Cocaine          300 THC              50    Recent Results (from  the past 240 hour(s))  Surgical pcr screen     Status: Abnormal   Collection Time: 04/12/16  2:06 AM    Result Value Ref Range Status   MRSA, PCR NEGATIVE NEGATIVE Final   Staphylococcus aureus POSITIVE (A) NEGATIVE Final    Comment:        The Xpert SA Assay (FDA approved for NASAL specimens in patients over 21 years of age), is one component of a comprehensive surveillance program.  Test performance has been validated by [facility] for patients greater than or equal to 1 year old. It is not intended to diagnose infection nor to guide or monitor treatment.    Creatinine:  Recent Labs  04/11/16 1905  CREATININE 0.98    Xrays: See report/chart Reviewed   Impression/Assessment:  Right ureteral stone with colic  Plan:  Reivewed cystoscopy and right retrograde and stent; with delayed u-scope; increased risk of ureteral edema and perforation discussed post ESWL if ESWL was at that level Patient was told stone was 6  Picture drawn; due to socal keep patient in overnight post stent After a thorough review of the management options for the patient's condition the patient  elected to proceed with surgical therapy as noted above. We have discussed the potential benefits and risks of the procedure, side effects of the proposed treatment, the likelihood of the patient achieving the goals of the procedure, and any potential problems that might occur during the procedure or recuperation. Informed consent has been obtained.  Montina Dorrance A 04/12/2016, 12:15 PM     

## 2016-04-12 NOTE — Anesthesia Procedure Notes (Signed)
Procedure Name: Intubation Date/Time: 04/12/2016 6:07 PM Performed by: Maxwell Caul Pre-anesthesia Checklist: Patient identified, Emergency Drugs available, Suction available and Patient being monitored Patient Re-evaluated:Patient Re-evaluated prior to inductionOxygen Delivery Method: Circle system utilized Preoxygenation: Pre-oxygenation with 100% oxygen Intubation Type: IV induction, Rapid sequence and Cricoid Pressure applied Laryngoscope Size: Mac and 4 Grade View: Grade I Tube type: Oral Tube size: 7.0 mm Number of attempts: 1 Airway Equipment and Method: Stylet and Oral airway Placement Confirmation: ETT inserted through vocal cords under direct vision,  positive ETCO2 and breath sounds checked- equal and bilateral Secured at: 21 cm Tube secured with: Tape Dental Injury: Teeth and Oropharynx as per pre-operative assessment

## 2016-04-13 DIAGNOSIS — R52 Pain, unspecified: Secondary | ICD-10-CM

## 2016-04-13 LAB — URINE CULTURE

## 2016-04-13 MED ORDER — CIPROFLOXACIN HCL 500 MG PO TABS: 500.0000 mg | ORAL_TABLET | Freq: Two times a day (BID) | ORAL | 0 refills | Status: AC

## 2016-04-13 NOTE — Progress Notes (Signed)
Patient alert and oriented with pain controlled. Patient given discharge instructions and prescriptions. Patient verbalized understanding of all discharge instructions. All questions and concerns answered.

## 2016-04-13 NOTE — Progress Notes (Signed)
Pt complained of dizziness while getting up to shower. Pt chose to complete shower. Vital signs were taken when she arrived back in bed and alerted staff of dizziness. Pt able to move about room with an even gait. Vital signs stable. Reported findings to doctor that wrote discharge orders. MD stated to have her take it easy over the next couple of days and not to drive.   Pt concerned about old IV site that had been taken out. Some tape residue and swelling noted. Heat and elevation recommended.  Allevn used to cover old site for patient comfort.  Assessment unchanged.  All questions pertaining to D/C we answered.  Pt was D/C'd via wheelchair and accompanied by NS/NT.  Pt placed into taxi, and voucher given to taxi driver.  Roselind Rily

## 2016-04-13 NOTE — Clinical Social Work Note (Signed)
[employer] voucher provided to RN to patient's home address listed in chart.   Voucher provided by: CSW AD.   Medical Social Worker will sign off for now as social work intervention is no longer needed. Please consult [place] again if new need arises.  Glendon Axe, MSW [phone] 04/13/2016 2:27 PM

## 2016-04-13 NOTE — Discharge Summary (Signed)
Physician Discharge Summary  Linda Hogan [id number] DOB: 06/25/1965 DOA: 04/11/2016  PCP: Tawni Pummel, MD  Admit date: 04/11/2016 Discharge date: 04/13/2016  Admitted From:Arrived from airport. Disposition:  Home  Recommendations for Outpatient Follow-up:  1. Follow up with PCP in 1-2 weeks 2. Please obtain BMP/CBC in one week   Home Health: No Equipment/Devices: No Discharge Condition: Stable CODE STATUS: Full code Diet recommendation: Heart healthy  Brief/Interim Summary:50 year old female with history of asthma, diabetes, nephrolithiasis presented with sudden onset of right flank pain. Patient was recently seen in the ER for nephrolithiasis status post lithotripsy and was discharged on Levaquin.  #Right sided 5 mm ureteral calculus with mild right hydronephrosis presented with renal colic: The patient was evaluated by urologist. Patient underwent cystoscopy, right retrograde ureterogram and insertion of right ureteral stent. Patient tolerated procedure well. She has a stable kidney function. Her symptoms improved. Denied fever, chills, nausea, vomiting, abdominal pain or back pain. Able to tolerate diet well. Education provided to the patient to follow-up with her PCP and urology as an outpatient.  I discussed with the patient's nurse and social worker regarding safe discharge plan. Patient is medically stable on discharge.  Discharge Diagnoses:  Principal Problem:   Nephrolithiasis Active Problems:   Anxiety   Chronic pain syndrome   Asthma, chronic, moderate persistent, uncomplicated   Kidney stone    Discharge Instructions  Discharge Instructions    Call MD for:  difficulty breathing, headache or visual disturbances    Complete by:  As directed    Call MD for:  extreme fatigue    Complete by:  As directed    Call MD for:  hives    Complete by:  As directed    Call MD for:  persistant dizziness or light-headedness    Complete by:  As directed    Call MD for:  persistant nausea and vomiting    Complete by:  As directed    Call MD for:  temperature >100.4    Complete by:  As directed    Diet - low sodium heart healthy    Complete by:  As directed    Discharge instructions    Complete by:  As directed    Please take yogurt/probiotics while on antibiotics.  Please follow up with urologist. Please don't drive today and while taking pain medication.   Increase activity slowly    Complete by:  As directed        Medication List    TAKE these medications   ciprofloxacin 500 MG tablet Commonly known as:  CIPRO Take 1 tablet (500 mg total) by mouth 2 (two) times daily.   EMBEDA 20-0.8 MG Cpcr Generic drug:  Morphine-Naltrexone Take 1 tablet by mouth 2 (two) times daily.   EMBRACE PRO GLUCOSE TEST VI by In Vitro route.   Fluticasone-Salmeterol 250-50 MCG/DOSE Aepb Commonly known as:  ADVAIR Inhale 1 puff into the lungs 2 (two) times daily.   gabapentin 800 MG tablet Commonly known as:  NEURONTIN Take 800 mg by mouth 3 (three) times daily.   tiZANidine 4 MG capsule Commonly known as:  ZANAFLEX Take 4 mg by mouth 2 (two) times daily.   venlafaxine 50 MG tablet Commonly known as:  EFFEXOR Take 50 mg by mouth 2 (two) times daily.      Follow-up Information    ATWATER, Myrna Blazer, MD. Schedule an appointment as soon as possible for a visit in 1 week(s).   Specialty:  Internal Medicine Contact information:  [place] [address] [phone]        MACDIARMID,SCOTT A, MD. Schedule an appointment as soon as possible for a visit in 2 week(s).   Specialty:  Urology Contact information: [address] [place] [address] [phone]          Allergies  Allergen Reactions  . Bee Venom Anaphylaxis  . Sulfamethoxazole Other (See Comments)  . Aspirin     Hives    . Sulfa Antibiotics     Upset stomach     Consultations: Urologist  Procedures/Studies: Cystoscopy and stent  placement  Subjective: Patient was seen and examined at bedside. Patient reported feeling much better today. Denied fever, chills, headache, dizziness, nausea, vomiting, chest pain, shortness of breath, abdominal pain or back pain. Able to tolerate diet well.   Discharge Exam: Vitals:   04/13/16 0532 04/13/16 1000  BP: (!) 98/50 110/70  Pulse: 78 62  Resp: 16 18  Temp: 97.7 F (36.5 C) 98.3 F (36.8 C)   Vitals:   04/13/16 0210 04/13/16 0532 04/13/16 0830 04/13/16 1000  BP: (!) 112/57 (!) 98/50  110/70  Pulse: 74 78  62  Resp: 16 16  18   Temp: 98.2 F (36.8 C) 97.7 F (36.5 C)  98.3 F (36.8 C)  TempSrc: Oral Oral  Oral  SpO2: 100% 100% 99% 100%  Weight:      Height:        General: Pt is alert, awake, not in acute distress Cardiovascular: RRR, S1/S2 +, no rubs, no gallops Respiratory: CTA bilaterally, no wheezing, no rhonchi Abdominal: Soft, NT, ND, bowel sounds + Extremities: no edema, no cyanosis    The results of significant diagnostics from this hospitalization (including imaging, microbiology, ancillary and laboratory) are listed below for reference.     Microbiology: Recent Results (from the past 240 hour(s))  Urine culture     Status: Abnormal   Collection Time: 04/11/16  8:32 PM  Result Value Ref Range Status   Specimen Description URINE, CLEAN CATCH  Final   Special Requests NONE  Final   Culture (A)  Final    40,000 COLONIES/mL DIPHTHEROIDS(CORYNEBACTERIUM SPECIES) Standardized susceptibility testing for this organism is not available.    Report Status 04/13/2016 FINAL  Final  Surgical pcr screen     Status: Abnormal   Collection Time: 04/12/16  2:06 AM  Result Value Ref Range Status   MRSA, PCR NEGATIVE NEGATIVE Final   Staphylococcus aureus POSITIVE (A) NEGATIVE Final    Comment:        The Xpert SA Assay (FDA approved for NASAL specimens in patients over 69 years of age), is one component of a comprehensive surveillance program.  Test  performance has been validated by [facility] for patients greater than or equal to 15 year old. It is not intended to diagnose infection nor to guide or monitor treatment.      Labs: BNP (last 3 results) No results for input(s): BNP in the last 8760 hours. Basic Metabolic Panel:  Recent Labs Lab 04/11/16 1905  NA 138  K 4.3  CL 101  CO2 29  GLUCOSE 115*  BUN 18  CREATININE 0.98  CALCIUM 9.3   Liver Function Tests: No results for input(s): AST, ALT, ALKPHOS, BILITOT, PROT, ALBUMIN in the last 168 hours. No results for input(s): LIPASE, AMYLASE in the last 168 hours. No results for input(s): AMMONIA in the last 168 hours. CBC:  Recent Labs Lab 04/11/16 1905  WBC 5.2  NEUTROABS 2.1  HGB 12.8  HCT 38.9  MCV 92.4  PLT 232   Cardiac Enzymes: No results for input(s): CKTOTAL, CKMB, CKMBINDEX, TROPONINI in the last 168 hours. BNP: Invalid input(s): POCBNP CBG: No results for input(s): GLUCAP in the last 168 hours. D-Dimer No results for input(s): DDIMER in the last 72 hours. Hgb A1c No results for input(s): HGBA1C in the last 72 hours. Lipid Profile No results for input(s): CHOL, HDL, LDLCALC, TRIG, CHOLHDL, LDLDIRECT in the last 72 hours. Thyroid function studies No results for input(s): TSH, T4TOTAL, T3FREE, THYROIDAB in the last 72 hours.  Invalid input(s): FREET3 Anemia work up No results for input(s): VITAMINB12, FOLATE, FERRITIN, TIBC, IRON, RETICCTPCT in the last 72 hours. Urinalysis    Component Value Date/Time   COLORURINE YELLOW 04/11/2016 2032   APPEARANCEUR CLEAR 04/11/2016 2032   LABSPEC 1.012 04/11/2016 2032   PHURINE 7.0 04/11/2016 2032   GLUCOSEU NEGATIVE 04/11/2016 2032   HGBUR NEGATIVE 04/11/2016 2032   BILIRUBINUR NEGATIVE 04/11/2016 2032   KETONESUR NEGATIVE 04/11/2016 2032   PROTEINUR NEGATIVE 04/11/2016 2032   NITRITE NEGATIVE 04/11/2016 2032   LEUKOCYTESUR MODERATE (A) 04/11/2016 2032   Sepsis Labs Invalid input(s):  PROCALCITONIN,  WBC,  LACTICIDVEN Microbiology Recent Results (from the past 240 hour(s))  Urine culture     Status: Abnormal   Collection Time: 04/11/16  8:32 PM  Result Value Ref Range Status   Specimen Description URINE, CLEAN CATCH  Final   Special Requests NONE  Final   Culture (A)  Final    40,000 COLONIES/mL DIPHTHEROIDS(CORYNEBACTERIUM SPECIES) Standardized susceptibility testing for this organism is not available.    Report Status 04/13/2016 FINAL  Final  Surgical pcr screen     Status: Abnormal   Collection Time: 04/12/16  2:06 AM  Result Value Ref Range Status   MRSA, PCR NEGATIVE NEGATIVE Final   Staphylococcus aureus POSITIVE (A) NEGATIVE Final    Comment:        The Xpert SA Assay (FDA approved for NASAL specimens in patients over 20 years of age), is one component of a comprehensive surveillance program.  Test performance has been validated by [facility] for patients greater than or equal to 34 year old. It is not intended to diagnose infection nor to guide or monitor treatment.      Time coordinating discharge:27 minutes  SIGNED:   Rosita Fire, MD  [facility] 04/13/2016, 2:09 PM  If 7PM-7AM, please contact night-coverage [email or web address] Password [email or web address]

## 2016-04-13 NOTE — Progress Notes (Signed)
Looks good May send patient home  Very appreciative of medical team looking after her F/up with primary urologist

## 2016-04-16 ENCOUNTER — Encounter (HOSPITAL_COMMUNITY): Payer: Self-pay | Admitting: Urology

## 2016-04-16 NOTE — Anesthesia Postprocedure Evaluation (Signed)
Anesthesia Post Note  Patient: Media planner  Procedure(s) Performed: Procedure(s) (LRB): CYSTOSCOPY WITH RIGHT  RETROGRADE PYELOGRAM/RIGHT URETERAL STENT PLACEMENT (Right)  Patient location during evaluation: PACU Anesthesia Type: General Level of consciousness: awake and alert Pain management: pain level controlled Vital Signs Assessment: post-procedure vital signs reviewed and stable Respiratory status: spontaneous breathing, nonlabored ventilation, respiratory function stable and patient connected to nasal cannula oxygen Cardiovascular status: blood pressure returned to baseline and stable Postop Assessment: no signs of nausea or vomiting Anesthetic complications: no    Last Vitals:  Vitals:   04/13/16 1000 04/13/16 1646  BP: 110/70 125/62  Pulse: 62 79  Resp: 18 18  Temp: 36.8 C 36.9 C    Last Pain:  Vitals:   04/13/16 1800  TempSrc:   PainSc: 5                  Stephanieann Popescu,JAMES TERRILL

## 2016-05-09 ENCOUNTER — Other Ambulatory Visit: Payer: Self-pay | Admitting: Urology

## 2016-05-16 NOTE — Patient Instructions (Addendum)
Lyllie Lograsso  05/16/2016   Your procedure is scheduled on: 05-18-16  Report to [facility]  Entrance take [facility]  elevators to 3rd floor to  [place] at 530  AM.  Call this number if you have problems the morning of surgery [phone]   Remember: ONLY 1 PERSON MAY GO WITH YOU TO SHORT STAY TO GET  READY MORNING OF [place].  Do not eat food or drink liquids :After Midnight.     Take these medicines the morning of surgery with A SIP OF WATER:  PROAIR INHALER IF NEEDED AND BRING INHALER, EMBEDA, ADVAIR, GABAPENTIN(NEURONTIN), HYDROZALINE (VISTARIL), VENLAFAXINE (EFFEXOR)              You may not have any metal on your body including hair pins and              piercings  Do not wear jewelry, make-up, lotions, powders or perfumes, deodorant             Do not wear nail polish.  Do not shave  48 hours prior to surgery.              Men may shave face and neck.   Do not bring valuables to the hospital. [place].  Contacts, dentures or bridgework may not be worn into surgery.  Leave suitcase in the car. After surgery it may be brought to your room.     Patients discharged the day of surgery will not be allowed to drive home.  Name and phone number of your driver: darryel significant other cell [phone]  Special Instructions: N/A              Please read over the following fact sheets you were given: _____________________________________________________________________             [facility] - Preparing for Surgery Before surgery, you can play an important role.  Because skin is not sterile, your skin needs to be as free of germs as possible.  You can reduce the number of germs on your skin by washing with CHG (chlorahexidine gluconate) soap before surgery.  CHG is an antiseptic cleaner which kills germs and bonds with the skin to continue killing germs even after washing. Please DO NOT  use if you have an allergy to CHG or antibacterial soaps.  If your skin becomes reddened/irritated stop using the CHG and inform your nurse when you arrive at Short Stay. Do not shave (including legs and underarms) for at least 48 hours prior to the first CHG shower.  You may shave your face/neck. Please follow these instructions carefully:  1.  Shower with CHG Soap the night before surgery and the  morning of Surgery.  2.  If you choose to wash your hair, wash your hair first as usual with your  normal  shampoo.  3.  After you shampoo, rinse your hair and body thoroughly to remove the  shampoo.                           4.  Use CHG as you would any other liquid soap.  You can apply chg directly  to the skin and wash  Gently with a scrungie or clean washcloth.  5.  Apply the CHG Soap to your body ONLY FROM THE NECK DOWN.   Do not use on face/ open                           Wound or open sores. Avoid contact with eyes, ears mouth and genitals (private parts).                       Wash face,  Genitals (private parts) with your normal soap.             6.  Wash thoroughly, paying special attention to the area where your surgery  will be performed.  7.  Thoroughly rinse your body with warm water from the neck down.  8.  DO NOT shower/wash with your normal soap after using and rinsing off  the CHG Soap.                9.  Pat yourself dry with a clean towel.            10.  Wear clean pajamas.            11.  Place clean sheets on your bed the night of your first shower and do not  sleep with pets. Day of Surgery : Do not apply any lotions/deodorants the morning of surgery.  Please wear clean clothes to the hospital/surgery center.  FAILURE TO FOLLOW THESE INSTRUCTIONS MAY RESULT IN THE CANCELLATION OF YOUR SURGERY PATIENT SIGNATURE_________________________________  NURSE  SIGNATURE__________________________________  ________________________________________________________________________

## 2016-05-16 NOTE — Progress Notes (Addendum)
EKG 04-11-16 PT OK TO TAKE EMBEDA AM OF SURGERY PER DR ROB FITZGERALD.

## 2016-05-17 ENCOUNTER — Encounter (HOSPITAL_COMMUNITY)
Admission: RE | Admit: 2016-05-17 | Discharge: 2016-05-17 | Disposition: A | Discharge status: Home / Self Care | Payer: Medicare PPO | Source: Ambulatory Visit | Attending: Urology | Admitting: Urology

## 2016-05-17 ENCOUNTER — Encounter (HOSPITAL_COMMUNITY): Payer: Self-pay

## 2016-05-17 ENCOUNTER — Encounter (INDEPENDENT_AMBULATORY_CARE_PROVIDER_SITE_OTHER): Payer: Self-pay

## 2016-05-17 DIAGNOSIS — Z841 Family history of disorders of kidney and ureter: Secondary | ICD-10-CM | POA: Diagnosis not present

## 2016-05-17 DIAGNOSIS — N201 Calculus of ureter: Secondary | ICD-10-CM | POA: Diagnosis not present

## 2016-05-17 DIAGNOSIS — J45909 Unspecified asthma, uncomplicated: Secondary | ICD-10-CM | POA: Diagnosis not present

## 2016-05-17 DIAGNOSIS — Z79899 Other long term (current) drug therapy: Secondary | ICD-10-CM | POA: Diagnosis not present

## 2016-05-17 DIAGNOSIS — Z7951 Long term (current) use of inhaled steroids: Secondary | ICD-10-CM | POA: Diagnosis not present

## 2016-05-17 DIAGNOSIS — N200 Calculus of kidney: Secondary | ICD-10-CM | POA: Diagnosis present

## 2016-05-17 DIAGNOSIS — Z79891 Long term (current) use of opiate analgesic: Secondary | ICD-10-CM | POA: Diagnosis not present

## 2016-05-17 HISTORY — DX: Malignant (primary) neoplasm, unspecified: C80.1

## 2016-05-17 HISTORY — DX: Unspecified osteoarthritis, unspecified site: M19.90

## 2016-05-17 HISTORY — DX: Headache, unspecified: R51.9

## 2016-05-17 HISTORY — DX: Headache: R51

## 2016-05-17 HISTORY — DX: Anesthesia of skin: R20.0

## 2016-05-17 HISTORY — DX: Personal history of urinary calculi: Z87.442

## 2016-05-17 HISTORY — DX: Unspecified asthma, uncomplicated: J45.909

## 2016-05-17 LAB — CBC
HCT: 39.7 % (ref 36.0–46.0)
HEMOGLOBIN: 12.8 g/dL (ref 12.0–15.0)
MCH: 30 pg (ref 26.0–34.0)
MCHC: 32.2 g/dL (ref 30.0–36.0)
MCV: 93.2 fL (ref 78.0–100.0)
PLATELETS: 235 10*3/uL (ref 150–400)
RBC: 4.26 MIL/uL (ref 3.87–5.11)
RDW: 12.5 % (ref 11.5–15.5)
WBC: 4 10*3/uL (ref 4.0–10.5)

## 2016-05-17 LAB — BASIC METABOLIC PANEL
ANION GAP: 6 (ref 5–15)
BUN: 14 mg/dL (ref 6–20)
CHLORIDE: 101 mmol/L (ref 101–111)
CO2: 31 mmol/L (ref 22–32)
CREATININE: 0.83 mg/dL (ref 0.44–1.00)
Calcium: 9.3 mg/dL (ref 8.9–10.3)
GFR calc non Af Amer: >60 mL/min (ref >60)
Glucose, Bld: 107 mg/dL — ABNORMAL HIGH (ref 65–99)
Potassium: 4.2 mmol/L (ref 3.5–5.1)
SODIUM: 138 mmol/L (ref 135–145)

## 2016-05-18 ENCOUNTER — Ambulatory Visit (HOSPITAL_COMMUNITY): Payer: Medicare PPO | Admitting: Certified Registered Nurse Anesthetist

## 2016-05-18 ENCOUNTER — Encounter (HOSPITAL_COMMUNITY)
Admission: RE | Disposition: A | Discharge status: Home / Self Care | Payer: Self-pay | Source: Ambulatory Visit | Attending: Urology

## 2016-05-18 ENCOUNTER — Ambulatory Visit (HOSPITAL_COMMUNITY)
Admission: RE | Admit: 2016-05-18 | Discharge: 2016-05-18 | Disposition: A | Discharge status: Home / Self Care | Payer: Medicare PPO | Source: Ambulatory Visit | Attending: Urology | Admitting: Urology

## 2016-05-18 ENCOUNTER — Encounter (HOSPITAL_COMMUNITY): Payer: Self-pay | Admitting: *Deleted

## 2016-05-18 DIAGNOSIS — J45909 Unspecified asthma, uncomplicated: Secondary | ICD-10-CM | POA: Insufficient documentation

## 2016-05-18 DIAGNOSIS — Z7951 Long term (current) use of inhaled steroids: Secondary | ICD-10-CM | POA: Insufficient documentation

## 2016-05-18 DIAGNOSIS — Z79899 Other long term (current) drug therapy: Secondary | ICD-10-CM | POA: Insufficient documentation

## 2016-05-18 DIAGNOSIS — Z841 Family history of disorders of kidney and ureter: Secondary | ICD-10-CM | POA: Insufficient documentation

## 2016-05-18 DIAGNOSIS — N201 Calculus of ureter: Secondary | ICD-10-CM | POA: Diagnosis not present

## 2016-05-18 DIAGNOSIS — N200 Calculus of kidney: Secondary | ICD-10-CM

## 2016-05-18 DIAGNOSIS — Z79891 Long term (current) use of opiate analgesic: Secondary | ICD-10-CM | POA: Insufficient documentation

## 2016-05-18 HISTORY — PX: CYSTOSCOPY WITH RETROGRADE PYELOGRAM, URETEROSCOPY AND STENT PLACEMENT: SHX5789

## 2016-05-18 SURGERY — CYSTOURETEROSCOPY, WITH RETROGRADE PYELOGRAM AND STENT INSERTION
Anesthesia: General | Laterality: Right

## 2016-05-18 MED ORDER — PHENAZOPYRIDINE HCL 200 MG PO TABS: 200.0000 mg | ORAL_TABLET | Freq: Three times a day (TID) | ORAL | Status: DC

## 2016-05-18 MED ORDER — BELLADONNA ALKALOIDS-OPIUM 16.2-60 MG RE SUPP
RECTAL | Status: AC
  Filled 2016-05-18: qty 1

## 2016-05-18 MED ORDER — TRAMADOL HCL 50 MG PO TABS
100.0000 mg | ORAL_TABLET | Freq: Four times a day (QID) | ORAL | Status: DC | PRN
  Administered 2016-05-18: 100 mg via ORAL
  Filled 2016-05-18 (×2): qty 2

## 2016-05-18 MED ORDER — PROMETHAZINE HCL 25 MG/ML IJ SOLN
6.2500 mg | INTRAMUSCULAR | Status: DC | PRN
  Administered 2016-05-18: 6.25 mg via INTRAVENOUS

## 2016-05-18 MED ORDER — CIPROFLOXACIN IN D5W 400 MG/200ML IV SOLN
INTRAVENOUS | Status: AC
  Filled 2016-05-18: qty 200

## 2016-05-18 MED ORDER — CIPROFLOXACIN HCL 500 MG PO TABS: 500.0000 mg | ORAL_TABLET | Freq: Once | ORAL | 0 refills | Status: DC

## 2016-05-18 MED ORDER — ONDANSETRON HCL 4 MG/2ML IJ SOLN
INTRAMUSCULAR | Status: AC
  Filled 2016-05-18: qty 2

## 2016-05-18 MED ORDER — FENTANYL CITRATE (PF) 100 MCG/2ML IJ SOLN
INTRAMUSCULAR | Status: DC | PRN
  Administered 2016-05-18: 50 ug via INTRAVENOUS
  Administered 2016-05-18 (×2): 25 ug via INTRAVENOUS

## 2016-05-18 MED ORDER — SODIUM CHLORIDE 0.9 % IR SOLN
Status: DC | PRN
  Administered 2016-05-18: 4000 mL via INTRAVESICAL

## 2016-05-18 MED ORDER — BELLADONNA ALKALOIDS-OPIUM 16.2-60 MG RE SUPP
RECTAL | Status: DC | PRN
  Administered 2016-05-18: 1 via RECTAL

## 2016-05-18 MED ORDER — MIDAZOLAM HCL 2 MG/2ML IJ SOLN
INTRAMUSCULAR | Status: AC
  Filled 2016-05-18: qty 2

## 2016-05-18 MED ORDER — 0.9 % SODIUM CHLORIDE (POUR BTL) OPTIME
TOPICAL | Status: DC | PRN
  Administered 2016-05-18: 1000 mL

## 2016-05-18 MED ORDER — TRAMADOL HCL 50 MG PO TABS: 50.0000 mg | ORAL_TABLET | Freq: Four times a day (QID) | ORAL | 0 refills | Status: AC | PRN

## 2016-05-18 MED ORDER — PROPOFOL 10 MG/ML IV BOLUS
INTRAVENOUS | Status: DC | PRN
  Administered 2016-05-18: 200 mg via INTRAVENOUS

## 2016-05-18 MED ORDER — MIDAZOLAM HCL 5 MG/5ML IJ SOLN
INTRAMUSCULAR | Status: DC | PRN
  Administered 2016-05-18: 1 mg via INTRAVENOUS

## 2016-05-18 MED ORDER — HYDROMORPHONE HCL 2 MG/ML IJ SOLN
0.2500 mg | INTRAMUSCULAR | Status: DC | PRN
  Administered 2016-05-18 (×2): 0.5 mg via INTRAVENOUS

## 2016-05-18 MED ORDER — GLYCOPYRROLATE 0.2 MG/ML IJ SOLN
INTRAMUSCULAR | Status: DC | PRN
  Administered 2016-05-18: 0.2 mg via INTRAVENOUS

## 2016-05-18 MED ORDER — LIDOCAINE HCL (CARDIAC) 20 MG/ML IV SOLN
INTRAVENOUS | Status: DC | PRN
  Administered 2016-05-18: 100 mg via INTRAVENOUS

## 2016-05-18 MED ORDER — CIPROFLOXACIN IN D5W 400 MG/200ML IV SOLN
400.0000 mg | INTRAVENOUS | Status: AC
  Administered 2016-05-18: 400 mg via INTRAVENOUS

## 2016-05-18 MED ORDER — PROMETHAZINE HCL 25 MG/ML IJ SOLN
INTRAMUSCULAR | Status: AC
  Filled 2016-05-18: qty 1

## 2016-05-18 MED ORDER — PROPOFOL 10 MG/ML IV BOLUS
INTRAVENOUS | Status: AC
  Filled 2016-05-18: qty 20

## 2016-05-18 MED ORDER — LACTATED RINGERS IV SOLN
INTRAVENOUS | Status: DC | PRN
  Administered 2016-05-18 (×2): via INTRAVENOUS

## 2016-05-18 MED ORDER — PHENAZOPYRIDINE HCL 200 MG PO TABS: 200.0000 mg | ORAL_TABLET | Freq: Three times a day (TID) | ORAL | 0 refills | Status: AC | PRN

## 2016-05-18 MED ORDER — DEXAMETHASONE SODIUM PHOSPHATE 10 MG/ML IJ SOLN
INTRAMUSCULAR | Status: AC
  Filled 2016-05-18: qty 1

## 2016-05-18 MED ORDER — GABAPENTIN 400 MG PO CAPS
800.0000 mg | ORAL_CAPSULE | Freq: Once | ORAL | Status: AC
  Administered 2016-05-18: 800 mg via ORAL
  Filled 2016-05-18: qty 2

## 2016-05-18 MED ORDER — DEXAMETHASONE SODIUM PHOSPHATE 10 MG/ML IJ SOLN
INTRAMUSCULAR | Status: DC | PRN
  Administered 2016-05-18: 10 mg via INTRAVENOUS

## 2016-05-18 MED ORDER — METOCLOPRAMIDE HCL 5 MG/ML IJ SOLN
INTRAMUSCULAR | Status: AC
  Filled 2016-05-18: qty 2

## 2016-05-18 MED ORDER — FENTANYL CITRATE (PF) 100 MCG/2ML IJ SOLN
INTRAMUSCULAR | Status: AC
  Filled 2016-05-18: qty 2

## 2016-05-18 MED ORDER — METOCLOPRAMIDE HCL 5 MG/ML IJ SOLN
INTRAMUSCULAR | Status: DC | PRN
  Administered 2016-05-18: 5 mg via INTRAVENOUS

## 2016-05-18 MED ORDER — LIDOCAINE 2% (20 MG/ML) 5 ML SYRINGE
INTRAMUSCULAR | Status: AC
  Filled 2016-05-18: qty 5

## 2016-05-18 MED ORDER — ONDANSETRON HCL 4 MG/2ML IJ SOLN
INTRAMUSCULAR | Status: DC | PRN
  Administered 2016-05-18: 4 mg via INTRAVENOUS

## 2016-05-18 MED ORDER — IOHEXOL 300 MG/ML  SOLN
INTRAMUSCULAR | Status: DC | PRN
  Administered 2016-05-18: 18 mL via URETHRAL

## 2016-05-18 MED ORDER — HYDROMORPHONE HCL 2 MG/ML IJ SOLN
INTRAMUSCULAR | Status: AC
  Filled 2016-05-18: qty 1

## 2016-05-18 SURGICAL SUPPLY — 20 items
BAG URO CATCHER STRL LF (MISCELLANEOUS) ×3 IMPLANT
BASKET DAKOTA 1.9FR 11X120 (BASKET) IMPLANT
BASKET ZERO TIP NITINOL 2.4FR (BASKET) IMPLANT
CATH URET 5FR 28IN OPEN ENDED (CATHETERS) ×3 IMPLANT
CLOTH BEACON ORANGE TIMEOUT ST (SAFETY) ×3 IMPLANT
FIBER LASER FLEXIVA 1000 (UROLOGICAL SUPPLIES) IMPLANT
FIBER LASER FLEXIVA 365 (UROLOGICAL SUPPLIES) IMPLANT
FIBER LASER FLEXIVA 550 (UROLOGICAL SUPPLIES) IMPLANT
FIBER LASER TRAC TIP (UROLOGICAL SUPPLIES) IMPLANT
GLOVE BIOGEL M STRL SZ7.5 (GLOVE) ×3 IMPLANT
GOWN STRL REUS W/TWL XL LVL3 (GOWN DISPOSABLE) ×3 IMPLANT
GUIDEWIRE ANG ZIPWIRE 038X150 (WIRE) ×3 IMPLANT
GUIDEWIRE STR DUAL SENSOR (WIRE) ×6 IMPLANT
MANIFOLD NEPTUNE II (INSTRUMENTS) ×3 IMPLANT
PACK CYSTO (CUSTOM PROCEDURE TRAY) ×3 IMPLANT
SHEATH ACCESS URETERAL 24CM (SHEATH) IMPLANT
SHEATH ACCESS URETERAL 38CM (SHEATH) IMPLANT
SHEATH ACCESS URETERAL 54CM (SHEATH) IMPLANT
TUBING CONNECTING 10 (TUBING) ×2 IMPLANT
TUBING CONNECTING 10' (TUBING) ×1

## 2016-05-18 NOTE — Discharge Instructions (Signed)
General Anesthesia, Adult, Care After These instructions provide you with information about caring for yourself after your procedure. Your health care provider may also give you more specific instructions. Your treatment has been planned according to current medical practices, but problems sometimes occur. Call your health care provider if you have any problems or questions after your procedure. What can I expect after the procedure? After the procedure, it is common to have:  Vomiting.  A sore throat.  Mental slowness. It is common to feel:  Nauseous.  Cold or shivery.  Sleepy.  Tired.  Sore or achy, even in parts of your body where you did not have surgery. Follow these instructions at home: For at least 24 hours after the procedure:  Do not:  Participate in activities where you could fall or become injured.  Drive.  Use heavy machinery.  Drink alcohol.  Take sleeping pills or medicines that cause drowsiness.  Make important decisions or sign legal documents.  Take care of children on your own.  Rest. Eating and drinking  If you vomit, drink water, juice, or soup when you can drink without vomiting.  Drink enough fluid to keep your urine clear or pale yellow.  Make sure you have little or no nausea before eating solid foods.  Follow the diet recommended by your health care provider. General instructions  Have a responsible adult stay with you until you are awake and alert.  Return to your normal activities as told by your health care provider. Ask your health care provider what activities are safe for you.  Take over-the-counter and prescription medicines only as told by your health care provider.  If you smoke, do not smoke without supervision.  Keep all follow-up visits as told by your health care provider. This is important. Contact a health care provider if:  You continue to have nausea or vomiting at home, and medicines are not helpful.  You  cannot drink fluids or start eating again.  You cannot urinate after 8-12 hours.  You develop a skin rash.  You have fever.  You have increasing redness at the site of your procedure. Get help right away if:  You have difficulty breathing.  You have chest pain.  You have unexpected bleeding.  You feel that you are having a life-threatening or urgent problem. This information is not intended to replace advice given to you by your health care provider. Make sure you discuss any questions you have with your health care provider. Document Released: 08/27/2000 Document Revised: 10/24/2015 Document Reviewed: 05/05/2015 Elsevier Interactive Patient Education  2017 [place] INSTRUCTIONS FOR KIDNEY STONE/URETERAL STENT   MEDICATIONS:  1.  Resume all your other meds from home - except do not take any extra narcotic pain meds that you may have at home.  2. Pyridium is to help with the burning/stinging when you urinate. 3. Tramadol is for moderate/severe pain, otherwise taking upto 1000 mg every 6 hours of plainTylenol will help treat your pain.     ACTIVITY:  1. No strenuous activity x 1week  2. No driving while on narcotic pain medications  3. Drink plenty of water  4. Continue to walk at home - you can still get blood clots when you are at home, so keep active, but don't over do it.  5. May return to work/school tomorrow or when you feel ready   BATHING:  1. You can shower and we recommend daily showers  2. You  have a string coming from your urethra: The stent string is attached to your ureteral stent. Do not pull on this.   SIGNS/SYMPTOMS TO CALL:  Please call us if you have a fever greater than 101.5, uncontrolled nausea/vomiting, uncontrolled pain, dizziness, unable to urinate, bloody urine, chest pain, shortness of breath, leg swelling, leg pain, redness around wound, drainage from wound, or any other concerns or questions.   You can reach [place] at  [phone].   FOLLOW-UP:  1. You have an appointment in 6 weeks with a ultrasound of your kidneys prior.

## 2016-05-18 NOTE — Transfer of Care (Signed)
Immediate Anesthesia Transfer of Care Note  Patient: Linda Hogan  Procedure(s) Performed: Procedure(s): RIGHT  RETROGRADE PYELOGRAM, URETEROSCOPY, CYSTOSCOPY AND STENT REMOVAL (Right)  Patient Location: PACU  Anesthesia Type:General  Level of Consciousness: awake, oriented, patient cooperative, lethargic and responds to stimulation  Airway & Oxygen Therapy: Patient Spontanous Breathing and Patient connected to face mask oxygen  Post-op Assessment: Report given to RN, Post -op Vital signs reviewed and stable and Patient moving all extremities  Post vital signs: Reviewed and stable  Last Vitals:  Vitals:   05/18/16 0546  BP: 96/78  Pulse: 78  Resp: 18  Temp: 36.6 C    Last Pain:  Vitals:   05/18/16 0546  TempSrc: Oral  PainSc: 7       Patients Stated Pain Goal: 4 ([id number])  Complications: No apparent anesthesia complications

## 2016-05-18 NOTE — H&P (Signed)
Acute Kidney Stone  HPI: Linda Hogan is a 50 year-old female established patient who is here for further eval and management of kidney stones.  She was diagnosed with a kidney stone on 04/08/2016.   The pain is on the right side.   Abdomen/Pelvic CT: 11/5 - mild right hydro, mid-ureteral stone. The patient underwent renal U/S prior to today's appointment.   The patient relates initially having nausea, flank pain, and voiding symptoms. She is currently having flank pain, back pain, nausea, and vomiting. She denies having groin pain, fever, chills, and voiding symptoms. She has been taking Flomax, cipro. She has not caught a stone in her urine strainer since her symptoms began.   She has had Ureteral Stent for treatment of her stones in the past. This is not her first kidney stone. She has had 1 stones prior to getting this one.   Patient has not tolerated the stent well - complaining of severe pain. She completed a long course of abx recently. No fevers/ chills.   CT scan shows mild right hydro with a calcification mid-way down the ureter question as to whether the stone is actually in the ureter or in an adjacent vessel.     ALLERGIES: Aspirin Bees Sulfa    MEDICATIONS: Tamsulosin Hcl 0.4 mg capsule, ext release 24 hr 1 capsule PO BID  Advair Diskus 250 mcg-50 mcg/dose blister, with inhalation device  Embeda  Gabapentin 800 mg tablet     GU PSH: Cystoscopy Insert Stent, Right - 04/12/2016 Hysterectomy, 2001      PSH Notes: back surgery (2000, 2011), Ankle surgery (1998)   NON-GU PSH: Cholecystectomy, 2006    GU PMH: None   NON-GU PMH: Anxiety Arthritis Asthma Depression    FAMILY HISTORY: 1 Daughter - Daughter 1 son - Son heart failure - Mother Kidney Stones - Mother   SOCIAL HISTORY: Marital Status: Single Current Smoking Status: Patient has never smoked.  Has never drank.  Drinks 2 caffeinated drinks per day. Patient's occupation is/was Disabled.     REVIEW OF SYSTEMS:    GU Review Female:   Patient reports burning /pain with urination and get up at night to urinate. Patient denies frequent urination, hard to postpone urination, leakage of urine, stream starts and stops, trouble starting your stream, have to strain to urinate, and currently pregnant.  Gastrointestinal (Upper):   Patient reports nausea. Patient denies vomiting and indigestion/ heartburn.  Gastrointestinal (Lower):   Patient reports diarrhea. Patient denies constipation.  Constitutional:   Patient reports weight loss and fatigue. Patient denies fever and night sweats.  Skin:   Patient denies skin rash/ lesion and itching.  Eyes:   Patient denies blurred vision and double vision.  Ears/ Nose/ Throat:   Patient reports sinus problems. Patient denies sore throat.  Hematologic/Lymphatic:   Patient reports easy bruising. Patient denies swollen glands.  Cardiovascular:   Patient denies leg swelling and chest pains.  Respiratory:   Patient denies cough and shortness of breath.  Endocrine:   Patient reports excessive thirst.   Musculoskeletal:   Patient reports back pain and joint pain.   Neurological:   Patient reports headaches. Patient denies dizziness.  Psychologic:   Patient reports depression and anxiety.    VITAL SIGNS:      05/08/2016 03:20 PM  Weight 178 lb / 80.74 kg  BP 100/62 mmHg  Pulse 65 /min  Temperature 97.0 F / 36 C   MULTI-SYSTEM PHYSICAL EXAMINATION:    Constitutional: Well-nourished. No physical deformities.  Normally developed. Good grooming.  Neck: Neck symmetrical, not swollen. Normal tracheal position.  Respiratory: No labored breathing, no use of accessory muscles. CTA- b/l  Cardiovascular: Normal temperature, normal extremity pulses, no swelling, no varicosities. RRR  Gastrointestinal: No mass, no tenderness, no rigidity, non obese abdomen.      PAST DATA REVIEWED:  Source Of History:  Patient   PROCEDURES:         KUB - 74000  A single  view of the abdomen is obtained. Renal shadows are easily visualized bilaterally. The stent appears to be well positioned in the right collecting system. There are no stones appreciated within the expected location in either renal pelvis. There is one calcification that is lateral to the stent which correlates with the stone on the CT scan. There are no additional calcifications along the expected location of either ureter bilaterally.  Gas pattern is grossly normal. No significant bony abnormalities.      Imp: Question as to whether the calcification noted on the CT correlates with the stone on the KUB which is significantly more lateral than the stent.         Urinalysis w/Scope Dipstick Dipstick Cont'd Micro  Color: Brown Bilirubin: Neg WBC/hpf: 0 - 5/hpf  Appearance: Cloudy Ketones: Trace RBC/hpf: >60/hpf  Specific Gravity: 1.020 Blood: 3+ Bacteria: Few (10-25/hpf)  pH: 5.5 Protein: 3+ Cystals: Amorph Urates  Glucose: Neg Urobilinogen: 0.2 Casts: NS (Not Seen)    Nitrites: Neg Trichomonas: Not Present    Leukocyte Esterase: Trace Mucous: Not Present      Epithelial Cells: 0 - 5/hpf      Yeast: NS (Not Seen)      Sperm: Not Present   ASSESSMENT:      ICD-10 Details  1 GU:   Calculus Ureter - N20.1 Right, Patient has mid-right ureteral stone vs. calcification in adjacent vessel. She has been stented. SHe has not tolerated the stent well.   PLAN:           Medications New Meds: Uribel 118 mg-10 mg-40.8 mg-36 mg-0.12 mg capsule 1 capsule PO Q 6 H PRN   #30  0 Refill(s)          Orders Labs Urine Culture and Sensitivity          Document Letter(s):  Created for Patient: Clinical Summary         Notes:   I discussed the CT and KUB with the patient, and let her know that I am not completely certain whether the stone noted on the CT is actually in her ureter. This would correlate with the lack of hydronephrosis. However we can only sort this out for certain with ureteroscopy.  I  discussed the procedure with the patient in detail, we discussed the r/b of the case. She has agreed to proceed.

## 2016-05-18 NOTE — Interval H&P Note (Signed)
History and Physical Interval Note:  05/18/2016 7:41 AM  Linda Hogan  has presented today for surgery, with the diagnosis of RIGHT URETERAL STONE  The various methods of treatment have been discussed with the patient and family. After consideration of risks, benefits and other options for treatment, the patient has consented to  Procedure(s): RIGHT  RETROGRADE PYELOGRAM, URETEROSCOPY AND STENT EXCHANGE (Right) HOLMIUM LASER APPLICATION (Right) as a surgical intervention .  The patient's history has been reviewed, patient examined, no change in status, stable for surgery.  I have reviewed the patient's chart and labs.  Questions were answered to the patient's satisfaction.     Louis Meckel W

## 2016-05-18 NOTE — Anesthesia Postprocedure Evaluation (Signed)
Anesthesia Post Note  Patient: Media planner  Procedure(s) Performed: Procedure(s) (LRB): RIGHT  RETROGRADE PYELOGRAM, URETEROSCOPY, CYSTOSCOPY AND STENT REMOVAL (Right)  Patient location during evaluation: PACU Anesthesia Type: General Level of consciousness: awake and alert Pain management: pain level controlled Vital Signs Assessment: post-procedure vital signs reviewed and stable Respiratory status: spontaneous breathing, nonlabored ventilation, respiratory function stable and patient connected to nasal cannula oxygen Cardiovascular status: blood pressure returned to baseline and stable Postop Assessment: no signs of nausea or vomiting Anesthetic complications: no    Last Vitals:  Vitals:   05/18/16 1041 05/18/16 1127  BP: 108/72 107/70  Pulse: 68 68  Resp: 14 16  Temp: 36.4 C 36.4 C    Last Pain:  Vitals:   05/18/16 1127  TempSrc: Oral  PainSc: 4                  Catalina Gravel

## 2016-05-18 NOTE — Anesthesia Preprocedure Evaluation (Addendum)
Anesthesia Evaluation  Patient identified by MRN, date of birth, ID band Patient awake    Reviewed: Allergy & Precautions, NPO status , Patient's Chart, lab work & pertinent test results  Airway Mallampati: II  TM Distance: >3 FB Neck ROM: Full    Dental  (+) Teeth Intact, Dental Advisory Given   Pulmonary neg pulmonary ROS, asthma ,    Pulmonary exam normal breath sounds clear to auscultation       Cardiovascular Exercise Tolerance: Good negative cardio ROS Normal cardiovascular exam Rhythm:Regular Rate:Normal     Neuro/Psych  Headaches, PSYCHIATRIC DISORDERS Anxiety Chronic back pain L leg, L buttock numbness    GI/Hepatic negative GI ROS, Neg liver ROS,   Endo/Other  negative endocrine ROS  Renal/GU negative Renal ROS     Musculoskeletal  (+) Arthritis , Osteoarthritis,    Abdominal   Peds  Hematology negative hematology ROS (+)   Anesthesia Other Findings Day of surgery medications reviewed with the patient.  Reproductive/Obstetrics                             Anesthesia Physical Anesthesia Plan  ASA: II  Anesthesia Plan: General   Post-op Pain Management:    Induction: Intravenous  Airway Management Planned: Oral ETT  Additional Equipment:   Intra-op Plan:   Post-operative Plan: Extubation in OR  Informed Consent: I have reviewed the patients History and Physical, chart, labs and discussed the procedure including the risks, benefits and alternatives for the proposed anesthesia with the patient or authorized representative who has indicated his/her understanding and acceptance.   Dental advisory given  Plan Discussed with: CRNA  Anesthesia Plan Comments: (Risks/benefits of general anesthesia discussed with patient including risk of damage to teeth, lips, gum, and tongue, nausea/vomiting, allergic reactions to medications, and the possibility of heart attack, stroke and  death.  All patient questions answered.  Patient wishes to proceed.)       Anesthesia Quick Evaluation

## 2016-05-18 NOTE — Anesthesia Procedure Notes (Signed)
Procedure Name: LMA Insertion Date/Time: 05/18/2016 7:49 AM Performed by: Catalina Gravel Pre-anesthesia Checklist: Patient identified, Emergency Drugs available, Suction available, Patient being monitored and Timeout performed Patient Re-evaluated:Patient Re-evaluated prior to inductionOxygen Delivery Method: Circle system utilized Preoxygenation: Pre-oxygenation with 100% oxygen Intubation Type: IV induction Ventilation: Mask ventilation without difficulty LMA: LMA with gastric port inserted LMA Size: 4.0 Number of attempts: 2 Placement Confirmation: breath sounds checked- equal and bilateral and positive ETCO2 Tube secured with: Tape Dental Injury: Teeth and Oropharynx as per pre-operative assessment

## 2016-05-18 NOTE — Op Note (Signed)
Preoperative diagnosis: right ureteral calculus  Postoperative diagnosis: lumbago, right  Procedure:  1. Cystoscopy 2. right ureteroscopy (negative) 3. Right ureteral stent removal 4. right retrograde pyelography with interpretation  Surgeon: Ardis Hughs, MD  Anesthesia: General  Complications: None  Intraoperative findings:  Right retrograde pyelography demonstrated no filling defect within the right ureter or upper tract.  Ureteroscopy confirmed that there was no additional stones within the patient's ureter or upper tract collecting system.  EBL: Minimal  Specimens: None  Disposition of specimens: [facility] for stone analysis  Indication: Linda Hogan is a 50 y.o.   patient who presented to the emergency department several weeks ago with right flank pain. CT scan at the time demonstrated a calcification along the course of the ureter. However, there is no hydronephrosis. The patient did have signs and symptoms of a urinary tract infection and as such was taken to the operating room for stent placement. KUB in follow-up demonstrated that the calcification in question was adjacent to the ureter, likely not in the ureter itself. However, the way to truly know this was to take the patient to the operating room and perform ureteroscopy. After reviewing the management options for treatment, the patient elected to proceed with the above surgical procedure(s). We have discussed the potential benefits and risks of the procedure, side effects of the proposed treatment, the likelihood of the patient achieving the goals of the procedure, and any potential problems that might occur during the procedure or recuperation. Informed consent has been obtained.   Description of procedure:  The patient was taken to the operating room and general anesthesia was induced.  The patient was placed in the dorsal lithotomy position, prepped and draped in the usual sterile fashion,  and preoperative antibiotics were administered. A preoperative time-out was performed.   Cystourethroscopy was performed.  The patient's urethra was examined and was normal. The bladder was then systematically examined in its entirety. There was no evidence for any bladder tumors, stones, or other mucosal pathology.    Attention then turned to the right ureteral orifice and the stent emanating from the orifice was grasped and pulled to the patient's urethral meatus. I then advanced a 0.038 sensor wire through the stent and up into the right renal pelvis. I then removed the stent over the wire. I then exchanged the wire for a 5 [place] open-ended ureteral catheter and performed retrograde pyelogram with the above findings. I then replaced the wire and advanced a 6/4 French semirigid ureteroscope up into the proximal ureter finding no stones. I then advanced second wire through the ureteroscope and backed out the ureteroscope over the wire. I then advanced the flexible ureteroscope over the second wire and into the renal pelvis. I then emptied the pelvis of any blood clot and debris and performed pyeloscopy under fluoroscopic guidance. All calyces were entered as confirmed with fluoroscopy and no stones were encountered. I then slowly backed out the scope long the ureter confirming that there were no stones and ensuring there was no additional ureteral trauma. I then opted not to leave a stent but did drain the patient's renal pelvis by exchanging the safety wire with a 5 [place] open-ended ureteral catheter. Once the renal pelvis it been drained I removed the open-ended stent, emptied the patient's bladder, inserted a B and O suppository into the patient's rectum, and the patient was subsequently activated and returned to the PACU in stable condition.  Disposition:  The patient has been scheduled for followup  in 6 weeks with a renal ultrasound.

## 2018-07-02 IMAGING — CT CT ABD-PELV W/O CM
2 of 4 series · 15 of 46 positions shown, 17 images · non-contrast
Comparison: None.

CLINICAL DATA: Right flank pain with nausea

EXAM:
CT ABDOMEN AND PELVIS WITHOUT CONTRAST
TECHNIQUE: Multidetector CT imaging of the abdomen and pelvis was performed
following the standard protocol without IV contrast.

[Series 2: a/p w/o 5mm · axial · non-contrast · 0.93mm/px · z∈[+833,+1218]mm · 12 of 93 slices shown, 14 images]
[im 8/93  soft-tissue]
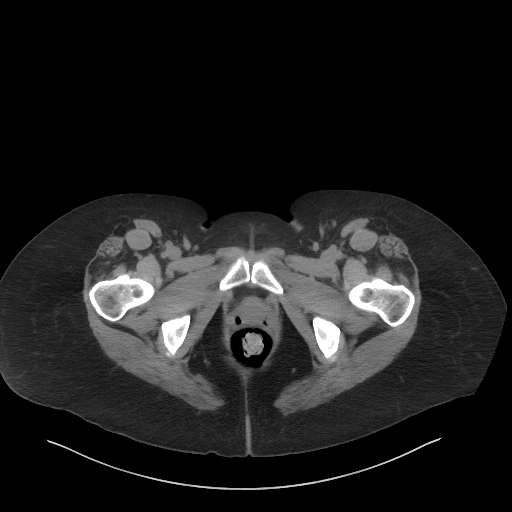
[im 8/93  bone]
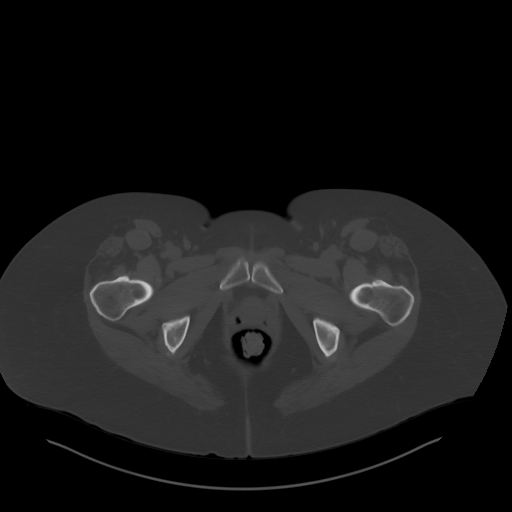
[im 15/93  soft-tissue]
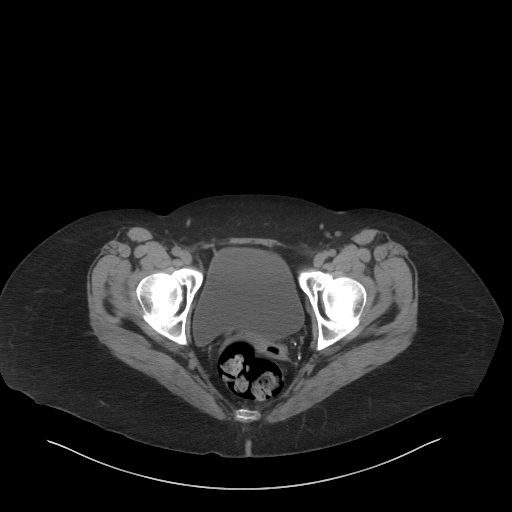
[im 22/93  soft-tissue]
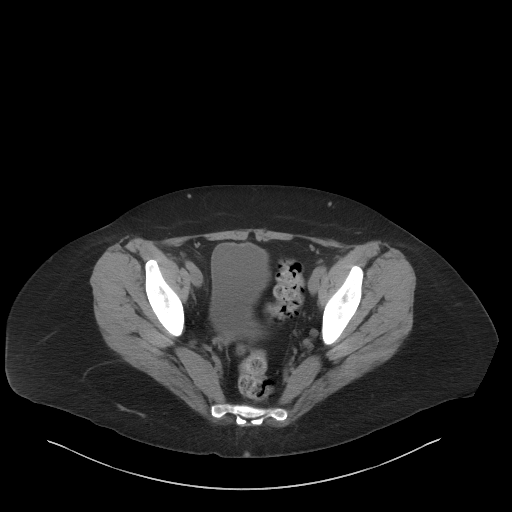
[im 29/93  soft-tissue]
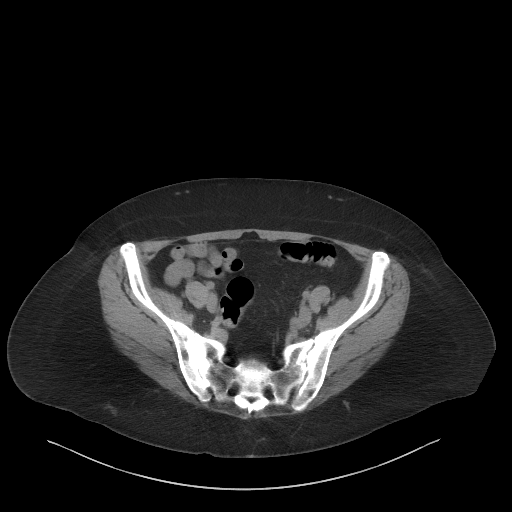
[im 36/93  soft-tissue]
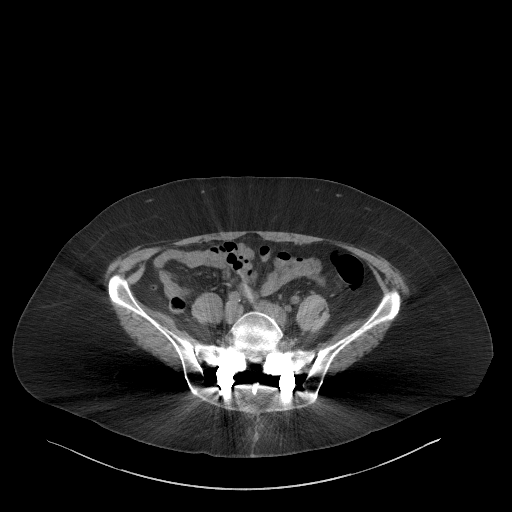
[im 43/93  soft-tissue]
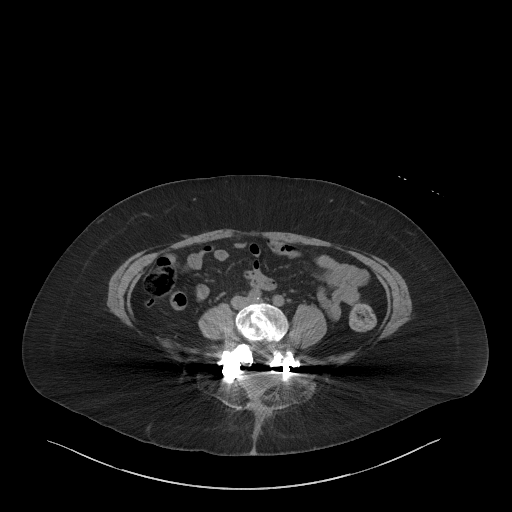
[im 50/93  soft-tissue]
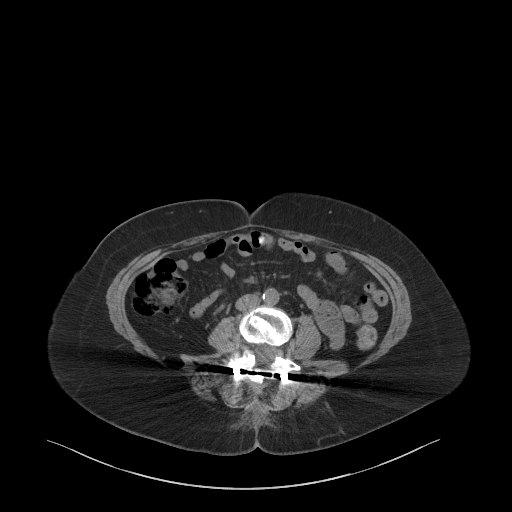
[im 57/93  soft-tissue]
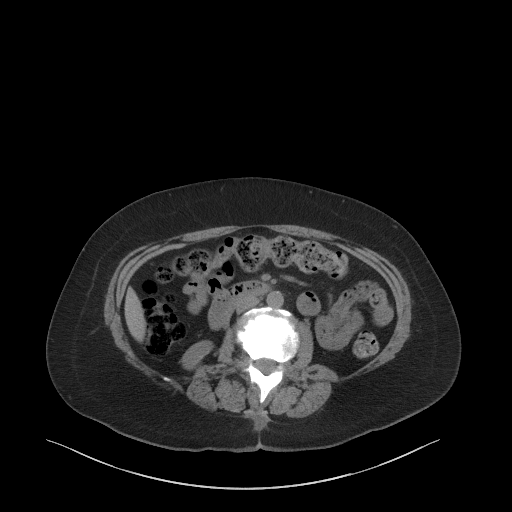
[im 64/93  soft-tissue]
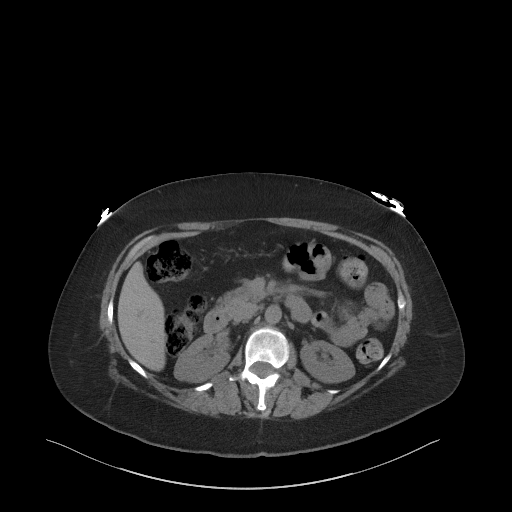
[im 64/93  bone]
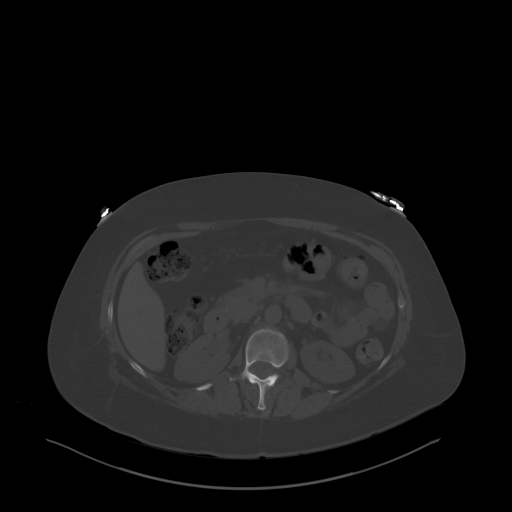
[im 71/93  soft-tissue]
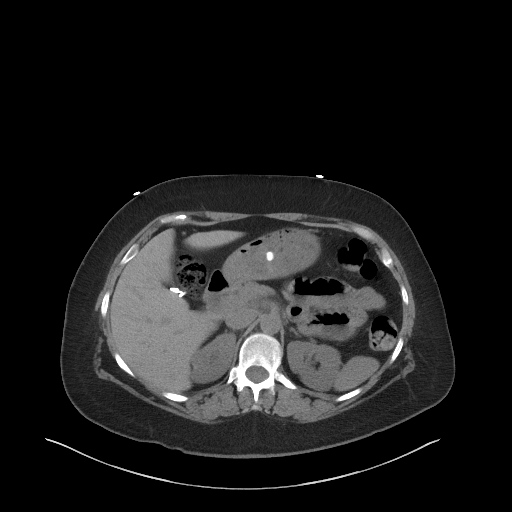
[im 78/93  soft-tissue]
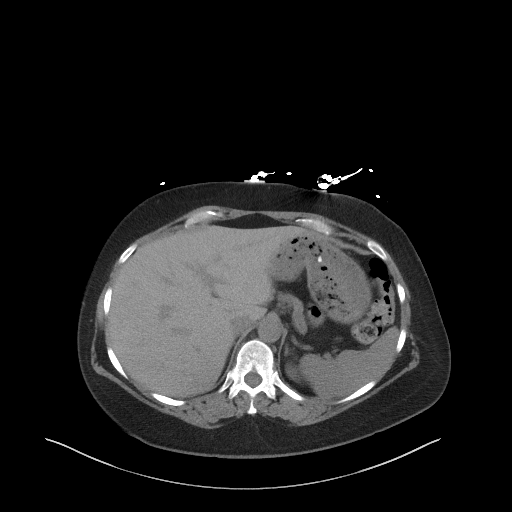
[im 85/93  soft-tissue]
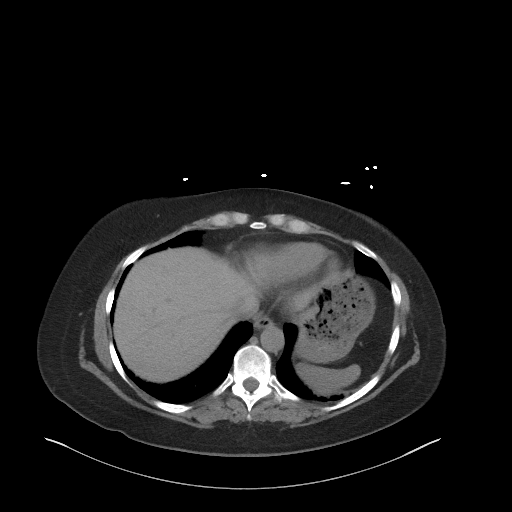

[Series 5: a/p w/o cor · coronal · non-contrast · 0.81mm/px · 3 of 124 slices shown]
[im 42/124  soft-tissue]
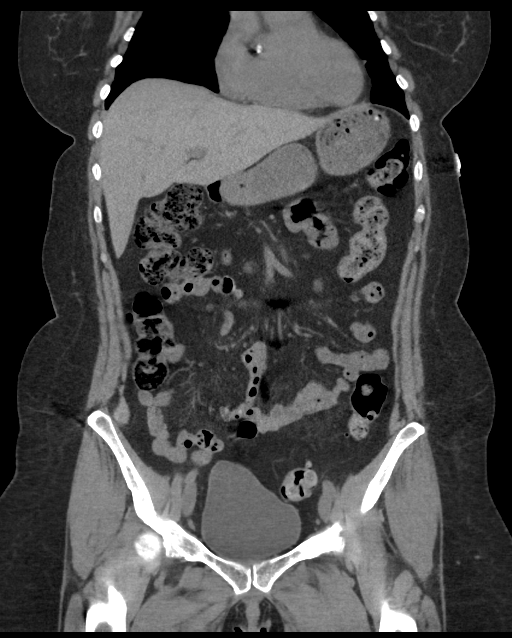
[im 55/124  soft-tissue]
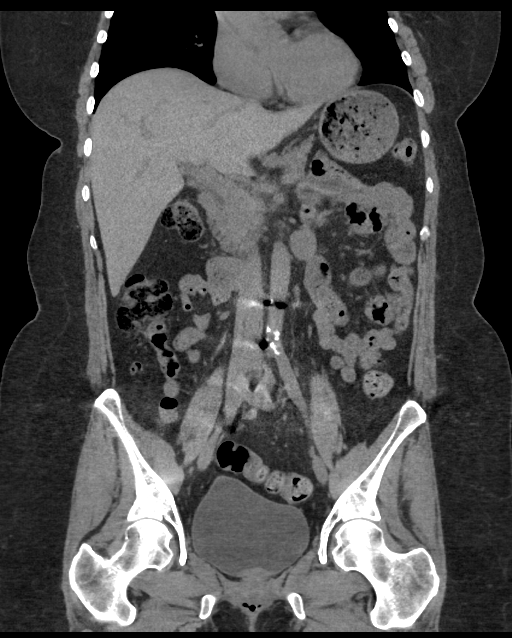
[im 69/124  soft-tissue]
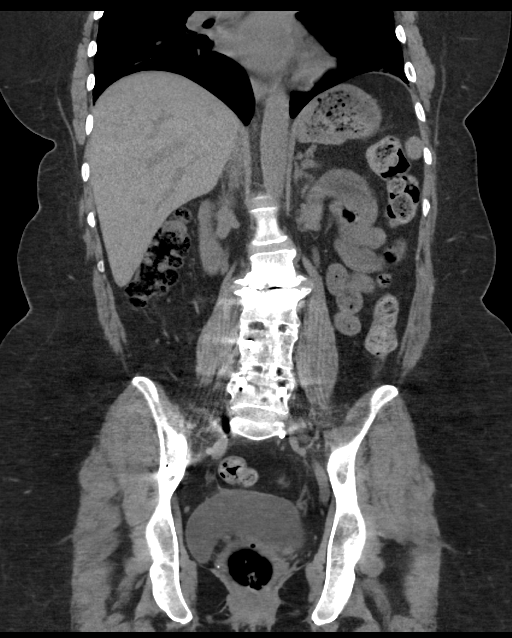

[15 of 46 positions shown; findings below may reference images not displayed]

FINDINGS: Lower chest: There are noncalcified tiny bilateral pulmonary nodules
seen within both lower lobes and lingula, the largest is in the left
lower lobe measuring approximately 4.7 mm. Streaky bibasilar
atelectasis and/or minimal scarring is seen. Visualized heart is
normal in size. No pericardial effusion.

Hepatobiliary: Cholecystectomy. No space-occupying mass of the
unenhanced liver or biliary dilatation.

Pancreas: Mild fullness of the pancreatic head without ductal
dilatation given the technical limitations from this noncontrast
study. This could be due to partial volume averaging artifact of
adjacent non-opacified bowel. No cystic appearing mass.

Spleen: No splenomegaly

Adrenals/Urinary Tract: Normal bilateral adrenal glands. Mild
right-sided hydroureteronephrosis secondary to a 5 mm mid ureteral
stone at the pelvic brim. Unremarkable left renal collecting system.
No nephrolithiasis. Urinary bladder is physiologically distended.

Stomach/Bowel: Moderate colonic stool burden. No small bowel
obstruction. There is mild duodenal and proximal jejunal
fluid-filled distention possibly representing localized ileus could
No acute inflammation. Normal appendix.

Vascular/Lymphatic: Aortic atherosclerosis. No enlarged abdominal or
pelvic lymph nodes.

Reproductive: Hysterectomy.  No adnexal mass.

Other:  No ascites or free air.

Musculoskeletal: There is discogenic sclerosis of the endplates
across the L2-3 interspace with approximately 5 mm retrolisthesis of
L2 on L3. Spinal fixation hardware span L2 through S1 with interbody
blocks noted at L2-3 and L3-4 with changes of spinal decompression.
Single screw traverses the right SI joint and sacral ala.
IMPRESSION: Bilateral noncalcified pulmonary nodules the largest is
approximately 4.7 mm in the left lower lobe. No follow-up needed if
patient is low-risk (and has no known or suspected primary
neoplasm). Non-contrast chest CT can be considered in 12 months if
patient is high-risk. This recommendation follows the consensus
statement: Guidelines for Management of Incidental Pulmonary Nodules
Detected on CT Images: From the [HOSPITAL] 7076; Radiology
7076; [DATE].

5 mm right ureteral calculus causing mild right-sided
hydroureteronephrosis. Mild proximal small bowel fluid-filled
distention possibly representing a mild localized ileus.
